# Patient Record
Sex: Female | Born: 1980
Health system: Southern US, Community
[De-identification: ages and names within clinical notes are randomized; demographics above are authoritative.]

## PROBLEM LIST (undated history)

## (undated) DIAGNOSIS — G8929 Other chronic pain: Secondary | ICD-10-CM

## (undated) DIAGNOSIS — E559 Vitamin D deficiency, unspecified: Secondary | ICD-10-CM

## (undated) DIAGNOSIS — K429 Umbilical hernia without obstruction or gangrene: Secondary | ICD-10-CM

## (undated) DIAGNOSIS — Q676 Pectus excavatum: Secondary | ICD-10-CM

## (undated) DIAGNOSIS — F419 Anxiety disorder, unspecified: Secondary | ICD-10-CM

## (undated) DIAGNOSIS — M797 Fibromyalgia: Secondary | ICD-10-CM

## (undated) DIAGNOSIS — M545 Low back pain, unspecified: Secondary | ICD-10-CM

## (undated) DIAGNOSIS — F32A Depression, unspecified: Secondary | ICD-10-CM

## (undated) DIAGNOSIS — R519 Headache, unspecified: Secondary | ICD-10-CM

## (undated) DIAGNOSIS — F319 Bipolar disorder, unspecified: Secondary | ICD-10-CM

## (undated) DIAGNOSIS — I499 Cardiac arrhythmia, unspecified: Secondary | ICD-10-CM

## (undated) DIAGNOSIS — M459 Ankylosing spondylitis of unspecified sites in spine: Secondary | ICD-10-CM

## (undated) DIAGNOSIS — F429 Obsessive-compulsive disorder, unspecified: Secondary | ICD-10-CM

## (undated) DIAGNOSIS — F329 Major depressive disorder, single episode, unspecified: Secondary | ICD-10-CM

## (undated) DIAGNOSIS — I10 Essential (primary) hypertension: Secondary | ICD-10-CM

## (undated) HISTORY — DX: Other chronic pain: G89.29

## (undated) HISTORY — DX: Pectus excavatum: Q67.6

## (undated) HISTORY — PX: TONSILLECTOMY: SUR1361

## (undated) HISTORY — PX: ABLATION: SHX5711

## (undated) HISTORY — DX: Headache, unspecified: R51.9

## (undated) HISTORY — DX: Bipolar disorder, unspecified: F31.9

## (undated) HISTORY — DX: Low back pain, unspecified: M54.50

## (undated) HISTORY — DX: Vitamin D deficiency, unspecified: E55.9

---
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
## 2013-08-01 ENCOUNTER — Emergency Department (HOSPITAL_COMMUNITY)
Admission: EM | Admit: 2013-08-01 | Discharge: 2013-08-01 | Disposition: A | Discharge status: Home / Self Care | Payer: Self-pay | Attending: Emergency Medicine | Admitting: Emergency Medicine

## 2013-08-01 ENCOUNTER — Encounter (HOSPITAL_COMMUNITY): Payer: Self-pay | Admitting: Emergency Medicine

## 2013-08-01 DIAGNOSIS — Y929 Unspecified place or not applicable: Secondary | ICD-10-CM | POA: Insufficient documentation

## 2013-08-01 DIAGNOSIS — Z87891 Personal history of nicotine dependence: Secondary | ICD-10-CM | POA: Insufficient documentation

## 2013-08-01 DIAGNOSIS — W261XXA Contact with sword or dagger, initial encounter: Secondary | ICD-10-CM

## 2013-08-01 DIAGNOSIS — S0181XA Laceration without foreign body of other part of head, initial encounter: Secondary | ICD-10-CM

## 2013-08-01 DIAGNOSIS — Z23 Encounter for immunization: Secondary | ICD-10-CM | POA: Insufficient documentation

## 2013-08-01 DIAGNOSIS — Y9389 Activity, other specified: Secondary | ICD-10-CM | POA: Insufficient documentation

## 2013-08-01 DIAGNOSIS — W260XXA Contact with knife, initial encounter: Secondary | ICD-10-CM | POA: Insufficient documentation

## 2013-08-01 DIAGNOSIS — S0180XA Unspecified open wound of other part of head, initial encounter: Secondary | ICD-10-CM | POA: Insufficient documentation

## 2013-08-01 MED ORDER — HYDROCODONE-ACETAMINOPHEN 5-325 MG PO TABS
2.0000 | ORAL_TABLET | Freq: Once | ORAL | Status: AC
  Administered 2013-08-01: 2 via ORAL
  Filled 2013-08-01: qty 2

## 2013-08-01 MED ORDER — LIDOCAINE-EPINEPHRINE-TETRACAINE (LET) SOLUTION
3.0000 mL | Freq: Once | NASAL | Status: AC
  Administered 2013-08-01: 3 mL via TOPICAL
  Filled 2013-08-01: qty 3

## 2013-08-01 MED ORDER — TETANUS-DIPHTH-ACELL PERTUSSIS 5-2.5-18.5 LF-MCG/0.5 IM SUSP
0.5000 mL | Freq: Once | INTRAMUSCULAR | Status: AC
  Administered 2013-08-01: 0.5 mL via INTRAMUSCULAR
  Filled 2013-08-01: qty 0.5

## 2013-08-01 NOTE — ED Provider Notes (Signed)
CSN: [id number]     Arrival date & time 08/01/13  0019 History   First MD Initiated Contact with Patient 08/01/13 0044     Chief Complaint  Patient presents with  . Laceration     (Consider location/radiation/quality/duration/timing/severity/associated sxs/prior Treatment) HPI Comments: Patient presents for laceration to chin sustained while attempting to remove an armband with a switch blade knife.  Patient is a 33 y.o. female presenting with skin laceration. The history is provided by the patient. No language interpreter was used.  Laceration Location:  Face Facial laceration location:  Chin Length (cm):  1 Depth:  Through dermis Quality: straight   Bleeding: controlled   Time since incident:  2 hours Laceration mechanism:  Knife Pain details:    Quality:  Aching   Severity:  Mild   Timing:  Constant   Progression:  Unchanged Foreign body present:  No foreign bodies Relieved by:  None tried Worsened by:  Pressure Ineffective treatments:  None tried Tetanus status:  Unknown   History reviewed. No pertinent past medical history. History reviewed. No pertinent past surgical history. No family history on file. History  Substance Use Topics  . Smoking status: Former Research scientist (life sciences)  . Smokeless tobacco: Not on file  . Alcohol Use: Yes     Comment: occ   OB History   Grav Para Term Preterm Abortions TAB SAB Ect Mult Living                 Review of Systems  Skin: Positive for wound.  Neurological: Negative for syncope and numbness.  Hematological: Does not bruise/bleed easily.  All other systems reviewed and are negative.     Allergies  Review of patient's allergies indicates no known allergies.  Home Medications   Prior to Admission medications   Not on File   BP 124/75  Pulse 98  Temp(Src) 98.4 F (36.9 C) (Oral)  Resp 18  Ht 5\' 6"  (1.676 m)  Wt 130 lb (58.968 kg)  BMI 20.99 kg/m2  SpO2 99%  LMP 07/18/2013  Physical Exam  Nursing note and vitals  reviewed. Constitutional: She is oriented to person, place, and time. She appears well-developed and well-nourished. No distress.  Nontoxic/nonseptic appearing  HENT:  Head: Normocephalic and atraumatic.  Eyes: Conjunctivae and EOM are normal. No scleral icterus.  Neck: Normal range of motion.  Pulmonary/Chest: Effort normal. No respiratory distress.  Musculoskeletal: Normal range of motion.  Neurological: She is alert and oriented to person, place, and time.  GCS 15. Speech is goal oriented. Sentences intelligible. Patient moves extremities without ataxia. Ambulatory with normal gait.  Skin: Skin is warm and dry. No rash noted. She is not diaphoretic. No erythema. No pallor.  1cm laceration to chin. No active bleeding.  Psychiatric: She has a normal mood and affect. Her behavior is normal.    ED Course  Procedures (including critical care time) Labs Review Labs Reviewed - No data to display  Imaging Review No results found.   EKG Interpretation None     LACERATION REPAIR Performed by: Antonietta Breach Authorized by: Antonietta Breach Consent: Verbal consent obtained. Risks and benefits: risks, benefits and alternatives were discussed Consent given by: patient Patient identity confirmed: provided demographic data Prepped and Draped in normal sterile fashion Wound explored  Laceration Location: chin  Laceration Length: 1cm  No Foreign Bodies seen or palpated  Anesthesia: local infiltration  Local anesthetic: topical LET  Anesthetic total: 5cc  Irrigation method: syringe Amount of cleaning: standard  Skin closure:  6-0 prolene  Number of sutures: 2  Technique: simple interrupted  Patient tolerance: Patient tolerated the procedure well with no immediate complications.  MDM   Final diagnoses:  Laceration of chin    Laceration to chin sustained while trying to remove an arm band with a switch blade knife. Tdap booster given. Pressure irrigation performed. Laceration  occurred < 8 hours prior to repair which was well tolerated. Pt has no comorbidities to effect normal wound healing. Discussed suture home care with patient and answered questions. Pt to follow up for wound check and suture removal in 5-7 days. Pt is hemodynamically stable w no complaints prior to discharge. Return precautions provided and patient agreeable to plan.   Filed Vitals:   08/01/13 0040 08/01/13 0222  BP: 124/75 130/96  Pulse: 98 81  Temp: 98.4 F (36.9 C) 98.5 F (36.9 C)  TempSrc: Oral Oral  Resp: 18 20  Height: 5\' 6"  (1.676 m)   Weight: 130 lb (58.968 kg)   SpO2: 99% 96%        Antonietta Breach, PA-C 08/01/13 [phone]

## 2013-08-01 NOTE — ED Notes (Signed)
LET on pt's chin.

## 2013-08-01 NOTE — ED Notes (Addendum)
Pt has 1/2 inch laceration to chin. Small amount of bleeding noted.

## 2013-08-01 NOTE — Discharge Instructions (Signed)

## 2013-08-01 NOTE — ED Notes (Signed)
Pt states she was attempting to removing arm band with switch blade knife and accidentally stabbed herself in chin. Small amount of bleeding noted.

## 2013-08-01 NOTE — ED Provider Notes (Signed)
Medical screening examination/treatment/procedure(s) were performed by non-physician practitioner and as supervising physician I was immediately available for consultation/collaboration.   EKG Interpretation None        Mariea Clonts, MD 08/01/13 [phone]

## 2017-07-30 ENCOUNTER — Other Ambulatory Visit: Payer: Self-pay | Admitting: Rheumatology

## 2017-07-30 DIAGNOSIS — M533 Sacrococcygeal disorders, not elsewhere classified: Secondary | ICD-10-CM

## 2017-08-14 ENCOUNTER — Inpatient Hospital Stay
Admission: RE | Admit: 2017-08-14 | Discharge: 2017-08-14 | Disposition: A | Discharge status: Home / Self Care | Payer: Self-pay | Source: Ambulatory Visit | Attending: Rheumatology | Admitting: Rheumatology

## 2018-02-17 DIAGNOSIS — H20012 Primary iridocyclitis, left eye: Secondary | ICD-10-CM | POA: Diagnosis not present

## 2018-03-03 DIAGNOSIS — H20012 Primary iridocyclitis, left eye: Secondary | ICD-10-CM | POA: Diagnosis not present

## 2018-04-17 DIAGNOSIS — G43909 Migraine, unspecified, not intractable, without status migrainosus: Secondary | ICD-10-CM | POA: Diagnosis not present

## 2018-04-17 DIAGNOSIS — H2013 Chronic iridocyclitis, bilateral: Secondary | ICD-10-CM | POA: Diagnosis not present

## 2018-04-17 DIAGNOSIS — Z8739 Personal history of other diseases of the musculoskeletal system and connective tissue: Secondary | ICD-10-CM | POA: Diagnosis not present

## 2018-04-17 DIAGNOSIS — Z23 Encounter for immunization: Secondary | ICD-10-CM | POA: Diagnosis not present

## 2018-04-17 DIAGNOSIS — F411 Generalized anxiety disorder: Secondary | ICD-10-CM | POA: Diagnosis not present

## 2018-09-12 DIAGNOSIS — M542 Cervicalgia: Secondary | ICD-10-CM | POA: Diagnosis not present

## 2018-09-12 DIAGNOSIS — S161XXS Strain of muscle, fascia and tendon at neck level, sequela: Secondary | ICD-10-CM | POA: Diagnosis not present

## 2018-10-02 DIAGNOSIS — R112 Nausea with vomiting, unspecified: Secondary | ICD-10-CM | POA: Diagnosis not present

## 2018-10-02 DIAGNOSIS — G43909 Migraine, unspecified, not intractable, without status migrainosus: Secondary | ICD-10-CM | POA: Diagnosis not present

## 2018-11-28 ENCOUNTER — Emergency Department (HOSPITAL_COMMUNITY): Payer: BC Managed Care – PPO

## 2018-11-28 ENCOUNTER — Other Ambulatory Visit: Payer: Self-pay

## 2018-11-28 ENCOUNTER — Emergency Department (HOSPITAL_COMMUNITY): Admission: EM | Admit: 2018-11-28 | Discharge: 2018-11-28 | Discharge status: Absent without leave | Payer: Self-pay

## 2018-11-28 ENCOUNTER — Emergency Department (HOSPITAL_COMMUNITY)
Admission: EM | Admit: 2018-11-28 | Discharge: 2018-11-28 | Disposition: A | Discharge status: Home / Self Care | Payer: BC Managed Care – PPO | Attending: Emergency Medicine | Admitting: Emergency Medicine

## 2018-11-28 DIAGNOSIS — R05 Cough: Secondary | ICD-10-CM | POA: Insufficient documentation

## 2018-11-28 DIAGNOSIS — R079 Chest pain, unspecified: Secondary | ICD-10-CM | POA: Diagnosis not present

## 2018-11-28 DIAGNOSIS — F17290 Nicotine dependence, other tobacco product, uncomplicated: Secondary | ICD-10-CM | POA: Insufficient documentation

## 2018-11-28 DIAGNOSIS — R9389 Abnormal findings on diagnostic imaging of other specified body structures: Secondary | ICD-10-CM | POA: Diagnosis not present

## 2018-11-28 DIAGNOSIS — R0781 Pleurodynia: Secondary | ICD-10-CM | POA: Diagnosis not present

## 2018-11-28 DIAGNOSIS — R091 Pleurisy: Secondary | ICD-10-CM | POA: Diagnosis not present

## 2018-11-28 DIAGNOSIS — R0602 Shortness of breath: Secondary | ICD-10-CM | POA: Diagnosis not present

## 2018-11-28 DIAGNOSIS — R059 Cough, unspecified: Secondary | ICD-10-CM

## 2018-11-28 LAB — BASIC METABOLIC PANEL
Anion gap: 8 (ref 5–15)
BUN: 11 mg/dL (ref 6–20)
CO2: 22 mmol/L (ref 22–32)
Calcium: 9.1 mg/dL (ref 8.9–10.3)
Chloride: 107 mmol/L (ref 98–111)
Creatinine, Ser: 0.6 mg/dL (ref 0.44–1.00)
GFR calc Af Amer: >60 mL/min (ref >60)
GFR calc non Af Amer: >60 mL/min (ref >60)
Glucose, Bld: 82 mg/dL (ref 70–99)
Potassium: 3.7 mmol/L (ref 3.5–5.1)
Sodium: 137 mmol/L (ref 135–145)

## 2018-11-28 LAB — CBC WITH DIFFERENTIAL/PLATELET
Abs Immature Granulocytes: 0.01 10*3/uL (ref 0.00–0.07)
Basophils Absolute: 0 10*3/uL (ref 0.0–0.1)
Basophils Relative: 0 %
Eosinophils Absolute: 0.1 10*3/uL (ref 0.0–0.5)
Eosinophils Relative: 2 %
HCT: 43 % (ref 36.0–46.0)
Hemoglobin: 14.6 g/dL (ref 12.0–15.0)
Immature Granulocytes: 0 %
Lymphocytes Relative: 24 %
Lymphs Abs: 1.8 10*3/uL (ref 0.7–4.0)
MCH: 31.8 pg (ref 26.0–34.0)
MCHC: 34 g/dL (ref 30.0–36.0)
MCV: 93.7 fL (ref 80.0–100.0)
Monocytes Absolute: 0.4 10*3/uL (ref 0.1–1.0)
Monocytes Relative: 6 %
Neutro Abs: 4.9 10*3/uL (ref 1.7–7.7)
Neutrophils Relative %: 68 %
Platelets: 281 10*3/uL (ref 150–400)
RBC: 4.59 MIL/uL (ref 3.87–5.11)
RDW: 12.5 % (ref 11.5–15.5)
WBC: 7.2 10*3/uL (ref 4.0–10.5)
nRBC: 0 % (ref 0.0–0.2)

## 2018-11-28 MED ORDER — IOHEXOL 350 MG/ML SOLN
100.0000 mL | Freq: Once | INTRAVENOUS | Status: AC | PRN
  Administered 2018-11-28: 100 mL via INTRAVENOUS

## 2018-11-28 MED ORDER — SODIUM CHLORIDE (PF) 0.9 % IJ SOLN
INTRAMUSCULAR | Status: AC
  Filled 2018-11-28: qty 50

## 2018-11-28 MED ORDER — IBUPROFEN 600 MG PO TABS: 600.0000 mg | ORAL_TABLET | Freq: Four times a day (QID) | ORAL | 0 refills | Status: DC | PRN

## 2018-11-28 MED ORDER — SODIUM CHLORIDE 0.9 % IV SOLN
INTRAVENOUS | Status: DC
  Administered 2018-11-28: 17:00:00 via INTRAVENOUS

## 2018-11-28 MED ORDER — KETOROLAC TROMETHAMINE 30 MG/ML IJ SOLN
30.0000 mg | Freq: Once | INTRAMUSCULAR | Status: AC
  Administered 2018-11-28: 19:00:00 30 mg via INTRAVENOUS
  Filled 2018-11-28: qty 1

## 2018-11-28 MED ORDER — ALBUTEROL SULFATE (2.5 MG/3ML) 0.083% IN NEBU: 5.0000 mg | INHALATION_SOLUTION | Freq: Once | RESPIRATORY_TRACT | Status: DC

## 2018-11-28 MED ORDER — METHOCARBAMOL 750 MG PO TABS: 750.0000 mg | ORAL_TABLET | Freq: Four times a day (QID) | ORAL | 0 refills | Status: DC

## 2018-11-28 NOTE — ED Triage Notes (Signed)
Pt states she has had L lateral mid rib pain radiating to L back x 1 week. Pt states area is tender and she states she has difficulty taking deep breaths due to pain. Denies fever. Was seen at [facility] today and XR done, disc with pt, UC MD told pt that there was "fluid in lung".

## 2018-11-28 NOTE — ED Provider Notes (Signed)
[facility] DEPT Provider Note   CSN: [id number] Arrival date & time: 11/28/18  1254     History   Chief Complaint Chief Complaint  Patient presents with  . Chest Pain  . Shortness of Breath    HPI Kayla Gross is a 38 y.o. female.     38 year old female presents with 1 week history of left-sided pleuritic chest pain.  Denies any fever or cough.  Pain is been gradually increasing.  No associated lower extremity pain.  Went to urgent care and had a chest x-ray which showed fluid on her lungs.  Patient does vape.  Sent here for further evaluation     No past medical history on file.  There are no active problems to display for this patient.   No past surgical history on file.   OB History   No obstetric history on file.      Home Medications    Prior to Admission medications   Not on File    Family History No family history on file.  Social History Social History   Tobacco Use  . Smoking status: Former Smoker  Substance Use Topics  . Alcohol use: Yes    Comment: occ  . Drug use: No     Allergies   Patient has no known allergies.   Review of Systems Review of Systems  All other systems reviewed and are negative.    Physical Exam Updated Vital Signs BP 137/84 (BP Location: Left Arm)   Pulse 68   Temp 98.5 F (36.9 C) (Oral)   Resp 16   LMP 11/19/2018 (Approximate)   SpO2 100%   Physical Exam Vitals signs and nursing note reviewed.  Constitutional:      General: She is not in acute distress.    Appearance: Normal appearance. She is well-developed. She is not toxic-appearing.  HENT:     Head: Normocephalic and atraumatic.  Eyes:     General: Lids are normal.     Conjunctiva/sclera: Conjunctivae normal.     Pupils: Pupils are equal, round, and reactive to light.  Neck:     Musculoskeletal: Normal range of motion and neck supple.     Thyroid: No thyroid mass.     Trachea: No tracheal deviation.   Cardiovascular:     Rate and Rhythm: Normal rate and regular rhythm.     Heart sounds: Normal heart sounds. No murmur. No gallop.   Pulmonary:     Effort: Pulmonary effort is normal. No respiratory distress.     Breath sounds: No stridor. Examination of the left-lower field reveals decreased breath sounds. Decreased breath sounds present. No wheezing, rhonchi or rales.  Abdominal:     General: Bowel sounds are normal. There is no distension.     Palpations: Abdomen is soft.     Tenderness: There is no abdominal tenderness. There is no rebound.  Musculoskeletal: Normal range of motion.        General: No tenderness.  Skin:    General: Skin is warm and dry.     Findings: No abrasion or rash.  Neurological:     Mental Status: She is alert and oriented to person, place, and time.     GCS: GCS eye subscore is 4. GCS verbal subscore is 5. GCS motor subscore is 6.     Cranial Nerves: No cranial nerve deficit.     Sensory: No sensory deficit.  Psychiatric:        Speech: Speech normal.  Behavior: Behavior normal.      ED Treatments / Results  Labs (all labs ordered are listed, but only abnormal results are displayed) Labs Reviewed  CBC WITH DIFFERENTIAL/PLATELET  BASIC METABOLIC PANEL    EKG EKG Interpretation  Date/Time:  Friday November 28 2018 13:24:48 EDT Ventricular Rate:  69 PR Interval:    QRS Duration: 115 QT Interval:  384 QTC Calculation: 412 R Axis:   40 Text Interpretation:  Sinus rhythm Probable left atrial enlargement Incomplete right bundle branch block No old tracing to compare Confirmed by Lacretia Leigh ([id number]) on 11/28/2018 4:00:36 PM   Radiology No results found.  Procedures Procedures (including critical care time)  Medications Ordered in ED Medications  0.9 %  sodium chloride infusion (has no administration in time range)     Initial Impression / Assessment and Plan / ED Course  I have reviewed the triage vital signs and the nursing  notes.  Pertinent labs & imaging results that were available during my care of the patient were reviewed by me and considered in my medical decision making (see chart for details).        Patient treated for pain here and does feel better.  Chest x-ray unremarkable and patient simply had a chest CT which was negative for PE or pleural effusion.  Suspect pleurisy and will treat for that  Final Clinical Impressions(s) / ED Diagnoses   Final diagnoses:  Cough    ED Discharge Orders    None       Lacretia Leigh, MD 11/28/18 [phone]

## 2018-12-10 ENCOUNTER — Other Ambulatory Visit: Payer: Self-pay

## 2018-12-10 ENCOUNTER — Emergency Department (HOSPITAL_COMMUNITY)
Admission: EM | Admit: 2018-12-10 | Discharge: 2018-12-10 | Discharge status: Against Medical Advice | Payer: BC Managed Care – PPO | Attending: Emergency Medicine | Admitting: Emergency Medicine

## 2018-12-10 ENCOUNTER — Encounter (HOSPITAL_COMMUNITY): Payer: Self-pay

## 2018-12-10 DIAGNOSIS — R0789 Other chest pain: Secondary | ICD-10-CM | POA: Diagnosis not present

## 2018-12-10 DIAGNOSIS — M542 Cervicalgia: Secondary | ICD-10-CM | POA: Diagnosis not present

## 2018-12-10 DIAGNOSIS — Z5321 Procedure and treatment not carried out due to patient leaving prior to being seen by health care provider: Secondary | ICD-10-CM | POA: Diagnosis not present

## 2018-12-10 NOTE — ED Triage Notes (Signed)
Pt c/o rib pain on the left side radiating to her neck and back. Pt was diagnosed with pleurisy several weeks ago and was told to come back if her pain was not any better. Pt did call her PCP and was told to come to the ER.

## 2018-12-11 DIAGNOSIS — R918 Other nonspecific abnormal finding of lung field: Secondary | ICD-10-CM | POA: Diagnosis not present

## 2018-12-11 DIAGNOSIS — R0602 Shortness of breath: Secondary | ICD-10-CM | POA: Diagnosis not present

## 2018-12-11 DIAGNOSIS — R0789 Other chest pain: Secondary | ICD-10-CM | POA: Diagnosis not present

## 2018-12-11 DIAGNOSIS — F17290 Nicotine dependence, other tobacco product, uncomplicated: Secondary | ICD-10-CM | POA: Diagnosis not present

## 2018-12-11 DIAGNOSIS — R7989 Other specified abnormal findings of blood chemistry: Secondary | ICD-10-CM | POA: Diagnosis not present

## 2018-12-11 DIAGNOSIS — R0781 Pleurodynia: Secondary | ICD-10-CM | POA: Diagnosis not present

## 2018-12-11 DIAGNOSIS — Z79899 Other long term (current) drug therapy: Secondary | ICD-10-CM | POA: Diagnosis not present

## 2018-12-17 DIAGNOSIS — I493 Ventricular premature depolarization: Secondary | ICD-10-CM | POA: Diagnosis not present

## 2018-12-17 DIAGNOSIS — F410 Panic disorder [episodic paroxysmal anxiety] without agoraphobia: Secondary | ICD-10-CM | POA: Insufficient documentation

## 2018-12-17 DIAGNOSIS — F419 Anxiety disorder, unspecified: Secondary | ICD-10-CM | POA: Insufficient documentation

## 2018-12-17 DIAGNOSIS — R079 Chest pain, unspecified: Secondary | ICD-10-CM | POA: Diagnosis not present

## 2018-12-17 DIAGNOSIS — R002 Palpitations: Secondary | ICD-10-CM | POA: Diagnosis not present

## 2018-12-19 ENCOUNTER — Emergency Department (HOSPITAL_COMMUNITY): Payer: BC Managed Care – PPO

## 2018-12-19 ENCOUNTER — Other Ambulatory Visit: Payer: Self-pay

## 2018-12-19 ENCOUNTER — Encounter (HOSPITAL_COMMUNITY): Payer: Self-pay

## 2018-12-19 DIAGNOSIS — R0602 Shortness of breath: Secondary | ICD-10-CM | POA: Diagnosis not present

## 2018-12-19 DIAGNOSIS — R079 Chest pain, unspecified: Secondary | ICD-10-CM | POA: Diagnosis not present

## 2018-12-19 DIAGNOSIS — R0789 Other chest pain: Secondary | ICD-10-CM | POA: Insufficient documentation

## 2018-12-19 DIAGNOSIS — Z79899 Other long term (current) drug therapy: Secondary | ICD-10-CM | POA: Diagnosis not present

## 2018-12-19 DIAGNOSIS — R42 Dizziness and giddiness: Secondary | ICD-10-CM | POA: Diagnosis not present

## 2018-12-19 DIAGNOSIS — Z87891 Personal history of nicotine dependence: Secondary | ICD-10-CM | POA: Insufficient documentation

## 2018-12-19 DIAGNOSIS — R55 Syncope and collapse: Secondary | ICD-10-CM | POA: Diagnosis not present

## 2018-12-19 LAB — CBC
HCT: 41.2 % (ref 36.0–46.0)
Hemoglobin: 14.3 g/dL (ref 12.0–15.0)
MCH: 32.2 pg (ref 26.0–34.0)
MCHC: 34.7 g/dL (ref 30.0–36.0)
MCV: 92.8 fL (ref 80.0–100.0)
Platelets: 310 10*3/uL (ref 150–400)
RBC: 4.44 MIL/uL (ref 3.87–5.11)
RDW: 12.1 % (ref 11.5–15.5)
WBC: 12.5 10*3/uL — ABNORMAL HIGH (ref 4.0–10.5)
nRBC: 0 % (ref 0.0–0.2)

## 2018-12-19 LAB — I-STAT BETA HCG BLOOD, ED (NOT ORDERABLE): I-stat hCG, quantitative: <5 m[IU]/mL (ref <5)

## 2018-12-19 LAB — BASIC METABOLIC PANEL
Anion gap: 9 (ref 5–15)
BUN: 8 mg/dL (ref 6–20)
CO2: 21 mmol/L — ABNORMAL LOW (ref 22–32)
Calcium: 9.3 mg/dL (ref 8.9–10.3)
Chloride: 107 mmol/L (ref 98–111)
Creatinine, Ser: 0.63 mg/dL (ref 0.44–1.00)
GFR calc Af Amer: >60 mL/min (ref >60)
GFR calc non Af Amer: >60 mL/min (ref >60)
Glucose, Bld: 95 mg/dL (ref 70–99)
Potassium: 3.7 mmol/L (ref 3.5–5.1)
Sodium: 137 mmol/L (ref 135–145)

## 2018-12-19 LAB — TROPONIN I (HIGH SENSITIVITY)
Troponin I (High Sensitivity): <2 ng/L (ref <18)
Troponin I (High Sensitivity): <2 ng/L (ref <18)

## 2018-12-19 MED ORDER — SODIUM CHLORIDE 0.9% FLUSH: 3.0000 mL | Freq: Once | INTRAVENOUS | Status: DC

## 2018-12-19 NOTE — ED Triage Notes (Signed)
Patient reports that she has had SOB x 1 month, but today the SOB is much worse. Patient states talking, walking has been a struggle when it normally isn't. Patient also c/o near syncope and then she became anxious. Patient reports that she was diagnosed with pleurisy recently.

## 2018-12-20 ENCOUNTER — Emergency Department (HOSPITAL_COMMUNITY)
Admission: EM | Admit: 2018-12-20 | Discharge: 2018-12-20 | Disposition: A | Discharge status: Home / Self Care | Payer: BC Managed Care – PPO | Attending: Emergency Medicine | Admitting: Emergency Medicine

## 2018-12-20 ENCOUNTER — Other Ambulatory Visit: Payer: Self-pay

## 2018-12-20 DIAGNOSIS — R0789 Other chest pain: Secondary | ICD-10-CM

## 2018-12-20 DIAGNOSIS — R0602 Shortness of breath: Secondary | ICD-10-CM

## 2018-12-20 DIAGNOSIS — R55 Syncope and collapse: Secondary | ICD-10-CM

## 2018-12-20 LAB — T4, FREE: Free T4: 1.03 ng/dL (ref 0.61–1.12)

## 2018-12-20 LAB — TSH: TSH: 1.77 u[IU]/mL (ref 0.350–4.500)

## 2018-12-20 MED ORDER — SODIUM CHLORIDE 0.9 % IV BOLUS
1000.0000 mL | Freq: Once | INTRAVENOUS | Status: AC
  Administered 2018-12-20: 1000 mL via INTRAVENOUS

## 2018-12-20 MED ORDER — KETOROLAC TROMETHAMINE 15 MG/ML IJ SOLN
15.0000 mg | Freq: Once | INTRAMUSCULAR | Status: AC
  Administered 2018-12-20: 02:00:00 15 mg via INTRAVENOUS
  Filled 2018-12-20: qty 1

## 2018-12-20 NOTE — ED Notes (Signed)
AVS and incentive spirometer reviewed with patient. Patient demonstrated teach back and use of incentive spirometer.

## 2018-12-20 NOTE — ED Provider Notes (Signed)
[facility] DEPT Provider Note  CSN: [id number] Arrival date & time: 12/19/18 1812  Chief Complaint(s) Shortness of Breath, Anxiety, and Near Syncope  HPI Kayla Gross is a 38 y.o. female   The history is provided by the patient.  Near Syncope This is a new problem. The current episode started 3 to 5 hours ago. Episode frequency: ONCE. The problem has been resolved. Associated symptoms include chest pain (Chest wall pain,) and shortness of breath (chronic). Pertinent negatives include no abdominal pain and no headaches. The symptoms are aggravated by standing. Relieved by: sitting.   Patient reports that she was at work when the episode occurred.  Stated she was crouching down and stood up.  That is when she had the near syncopal episode.  No recent fevers or infections.  No nausea vomiting.  No diarrhea.  Patient endorsing chest wall pain for which she has been evaluated.  It was attributed to pectoral excavatum.  Was recently evaluated in the emergency department and had negative CT scan for PEs.  Patient reports that she just turned in a Holter monitor yesterday.  Past Medical History History reviewed. No pertinent past medical history. There are no active problems to display for this patient.  Home Medication(s) Prior to Admission medications   Medication Sig Start Date End Date Taking? Authorizing Provider  FLUoxetine (PROZAC) 10 MG tablet Take 10 mg by mouth daily.    Yes [provider]  ibuprofen (ADVIL) 600 MG tablet Take 1 tablet (600 mg total) by mouth every 6 (six) hours as needed. Patient taking differently: Take 600 mg by mouth every 6 (six) hours as needed for moderate pain.  11/28/18  Yes Lacretia Leigh, MD  methocarbamol (ROBAXIN-750) 750 MG tablet Take 1 tablet (750 mg total) by mouth 4 (four) times daily. Patient taking differently: Take 750 mg by mouth every 8 (eight) hours as needed for muscle spasms.  11/28/18  Yes Lacretia Leigh, MD  promethazine (PHENERGAN) 12.5 MG tablet Take 12.5 mg by mouth every 8 (eight) hours as needed for nausea or vomiting.  10/02/18  Yes [provider]  SUMAtriptan (IMITREX) 50 MG tablet Take 50 mg by mouth every 2 (two) hours as needed for migraine.  10/27/18  Yes [provider]  traMADol (ULTRAM) 50 MG tablet Take 50 mg by mouth every 6 (six) hours as needed for moderate pain.  12/11/18  Yes [provider]                                                                                                                                    Past Surgical History Past Surgical History:  Procedure Laterality Date  . TONSILLECTOMY     Family History Family History  Family history unknown: Yes    Social History Social History   Tobacco Use  . Smoking status: Former Research scientist (life sciences)  . Smokeless tobacco: Never Used  Substance  Use Topics  . Alcohol use: Yes    Comment: occ  . Drug use: No   Allergies Patient has no known allergies.  Review of Systems Review of Systems  Respiratory: Positive for shortness of breath (chronic).   Cardiovascular: Positive for chest pain (Chest wall pain,) and near-syncope.  Gastrointestinal: Negative for abdominal pain.  Neurological: Negative for headaches.   All other systems are reviewed and are negative for acute change except as noted in the HPI  Physical Exam Vital Signs  I have reviewed the triage vital signs BP (!) 140/104 (BP Location: Left Arm)   Pulse 61   Temp 98.1 F (36.7 C) (Oral)   Resp 17   Ht 5\' 6"  (1.676 m)   Wt 79.4 kg   LMP 12/19/2018   SpO2 100%   BMI 28.25 kg/m   Physical Exam Vitals signs reviewed.  Constitutional:      General: She is not in acute distress.    Appearance: She is well-developed. She is not diaphoretic.  HENT:     Head: Normocephalic and atraumatic.     Nose: Nose normal.  Eyes:     General: No scleral icterus.       Right eye: No discharge.        Left eye: No  discharge.     Conjunctiva/sclera: Conjunctivae normal.     Pupils: Pupils are equal, round, and reactive to light.  Neck:     Musculoskeletal: Normal range of motion and neck supple.  Cardiovascular:     Rate and Rhythm: Normal rate and regular rhythm.     Heart sounds: No murmur. No friction rub. No gallop.   Pulmonary:     Effort: Pulmonary effort is normal. No respiratory distress.     Breath sounds: Normal breath sounds. No stridor. No rales.    Chest:     Chest wall: Tenderness present.    Abdominal:     General: There is no distension.     Palpations: Abdomen is soft.     Tenderness: There is no abdominal tenderness.  Musculoskeletal:        General: No tenderness.  Skin:    General: Skin is warm and dry.     Findings: No erythema or rash.  Neurological:     Mental Status: She is alert and oriented to person, place, and time.     ED Results and Treatments Labs (all labs ordered are listed, but only abnormal results are displayed) Labs Reviewed  BASIC METABOLIC PANEL - Abnormal; Notable for the following components:      Result Value   CO2 21 (*)    All other components within normal limits  CBC - Abnormal; Notable for the following components:   WBC 12.5 (*)    All other components within normal limits  TSH  T4, FREE  I-STAT BETA HCG BLOOD, ED ([facility], [facility], [facility] ONLY)  I-STAT BETA HCG BLOOD, ED (NOT ORDERABLE)  TROPONIN I (HIGH SENSITIVITY)  TROPONIN I (HIGH SENSITIVITY)  EKG  EKG Interpretation  Date/Time:  Friday December 19 2018 18:28:29 EDT Ventricular Rate:  101 PR Interval:    QRS Duration: 114 QT Interval:  392 QTC Calculation: 395 R Axis:   40 Text Interpretation:  Sinus tachycardia Ventricular bigeminy Probable left atrial enlargement Incomplete right bundle branch block Borderline low voltage, extremity leads Confirmed by Addison Lank [phone]) on 12/20/2018 5:19:11 AM       EKG Interpretation  Date/Time:  Saturday December 20 2018 01:51:41 EDT Ventricular Rate:  46 PR Interval:    QRS Duration: 117 QT Interval:  471 QTC Calculation: 412 R Axis:   35 Text Interpretation:  Sinus bradycardia Atrial premature complex Probable left atrial enlargement Nonspecific intraventricular conduction delay Borderline T abnormalities, anterior leads bigemy resolved Confirmed by Addison Lank [phone]) on 12/20/2018 5:20:24 AM       Radiology Dg Chest 2 View  Result Date: 12/19/2018 CLINICAL DATA:  38 year female with chest pain and shortness of breath. EXAM: CHEST - 2 VIEW COMPARISON:  Chest radiograph dated 11/28/2018 FINDINGS: The heart size and mediastinal contours are within normal limits. Both lungs are clear. The visualized skeletal structures are unremarkable. IMPRESSION: No active cardiopulmonary disease. Electronically Signed   By: Anner Crete M.D.   On: 12/19/2018 19:02    Pertinent labs & imaging results that were available during my care of the patient were reviewed by me and considered in my medical decision making (see chart for details).  Medications Ordered in ED Medications  sodium chloride flush (NS) 0.9 % injection 3 mL (has no administration in time range)  ketorolac (TORADOL) 15 MG/ML injection 15 mg (15 mg Intravenous Given 12/20/18 0149)  sodium chloride 0.9 % bolus 1,000 mL (0 mLs Intravenous Stopped 12/20/18 0452)                                                                                                                                    Procedures Procedures  (including critical care time)  Medical Decision Making / ED Course I have reviewed the nursing notes for this encounter and the patient's prior records (if available in EHR or on provided paperwork).   Kayla Gross was evaluated in Emergency Department on 12/20/2018 for the symptoms described in the history of present  illness. She was evaluated in the context of the global COVID-19 pandemic, which necessitated consideration that the patient might be at risk for infection with the SARS-CoV-2 virus that causes COVID-19. Institutional protocols and algorithms that pertain to the evaluation of patients at risk for COVID-19 are in a state of rapid change based on information released by regulatory bodies including the CDC and federal and state organizations. These policies and algorithms were followed during the patient's care in the ED.  Initial EKG in bigeminy.  Currently in normal sinus rhythm.  Repeat EKG without bigeminy.  No evidence of pericarditis.  No acute ischemic changes.  Troponin  negative x2.   Orthostatics reassuring.  Beta-hCG negative.  No significant electrolyte derangements or renal insufficiency.  TSH normal.  Provided with Toradol for chest wall pain and IV fluids.  Patient no longer symptomatic following IV fluids.  Able to stand up and ambulate without complication.  The patient appears reasonably screened and/or stabilized for discharge and I doubt any other medical condition or other [facility] requiring further screening, evaluation, or treatment in the ED at this time prior to discharge.  The patient is safe for discharge with strict return precautions.       Final Clinical Impression(s) / ED Diagnoses Final diagnoses:  Near syncope  Chest wall pain  Shortness of breath    The patient appears reasonably screened and/or stabilized for discharge and I doubt any other medical condition or other [facility] requiring further screening, evaluation, or treatment in the ED at this time prior to discharge.  Disposition: Discharge  Condition: Good  I have discussed the results, Dx and Tx plan with the patient who expressed understanding and agree(s) with the plan. Discharge instructions discussed at great length. The patient was given strict return precautions who verbalized understanding of the  instructions. No further questions at time of discharge.    ED Discharge Orders    None       Follow Up: [facility] [place] [address] [phone]  Schedule an appointment as soon as possible for a visit        This chart was dictated using voice recognition software.  Despite best efforts to proofread,  errors can occur which can change the documentation meaning.   Fatima Blank, MD 12/20/18 Delrae Rend

## 2018-12-22 DIAGNOSIS — Q676 Pectus excavatum: Secondary | ICD-10-CM | POA: Diagnosis not present

## 2018-12-25 DIAGNOSIS — R079 Chest pain, unspecified: Secondary | ICD-10-CM | POA: Diagnosis not present

## 2018-12-25 DIAGNOSIS — R002 Palpitations: Secondary | ICD-10-CM | POA: Diagnosis not present

## 2019-01-06 DIAGNOSIS — R079 Chest pain, unspecified: Secondary | ICD-10-CM | POA: Diagnosis not present

## 2019-01-06 DIAGNOSIS — R002 Palpitations: Secondary | ICD-10-CM | POA: Diagnosis not present

## 2019-01-07 ENCOUNTER — Institutional Professional Consult (permissible substitution): Payer: BC Managed Care – PPO | Admitting: Pulmonary Disease

## 2019-01-07 DIAGNOSIS — R55 Syncope and collapse: Secondary | ICD-10-CM | POA: Diagnosis not present

## 2019-01-07 DIAGNOSIS — R002 Palpitations: Secondary | ICD-10-CM | POA: Diagnosis not present

## 2019-01-07 DIAGNOSIS — F17200 Nicotine dependence, unspecified, uncomplicated: Secondary | ICD-10-CM | POA: Diagnosis not present

## 2019-01-07 DIAGNOSIS — Z79899 Other long term (current) drug therapy: Secondary | ICD-10-CM | POA: Diagnosis not present

## 2019-01-07 DIAGNOSIS — I493 Ventricular premature depolarization: Secondary | ICD-10-CM | POA: Diagnosis not present

## 2019-01-07 DIAGNOSIS — R42 Dizziness and giddiness: Secondary | ICD-10-CM | POA: Diagnosis not present

## 2019-01-09 ENCOUNTER — Other Ambulatory Visit: Payer: Self-pay | Admitting: *Deleted

## 2019-01-09 DIAGNOSIS — R918 Other nonspecific abnormal finding of lung field: Secondary | ICD-10-CM | POA: Diagnosis not present

## 2019-01-09 DIAGNOSIS — R079 Chest pain, unspecified: Secondary | ICD-10-CM | POA: Diagnosis not present

## 2019-01-09 DIAGNOSIS — M1990 Unspecified osteoarthritis, unspecified site: Secondary | ICD-10-CM | POA: Diagnosis not present

## 2019-01-09 DIAGNOSIS — F17290 Nicotine dependence, other tobacco product, uncomplicated: Secondary | ICD-10-CM | POA: Diagnosis not present

## 2019-01-09 DIAGNOSIS — F419 Anxiety disorder, unspecified: Secondary | ICD-10-CM | POA: Diagnosis not present

## 2019-01-09 DIAGNOSIS — Q676 Pectus excavatum: Secondary | ICD-10-CM

## 2019-01-09 DIAGNOSIS — Z8711 Personal history of peptic ulcer disease: Secondary | ICD-10-CM | POA: Diagnosis not present

## 2019-01-09 DIAGNOSIS — R0602 Shortness of breath: Secondary | ICD-10-CM | POA: Diagnosis not present

## 2019-01-09 DIAGNOSIS — F329 Major depressive disorder, single episode, unspecified: Secondary | ICD-10-CM | POA: Diagnosis not present

## 2019-01-09 DIAGNOSIS — Z79899 Other long term (current) drug therapy: Secondary | ICD-10-CM | POA: Diagnosis not present

## 2019-01-09 DIAGNOSIS — R0789 Other chest pain: Secondary | ICD-10-CM | POA: Diagnosis not present

## 2019-01-09 DIAGNOSIS — F410 Panic disorder [episodic paroxysmal anxiety] without agoraphobia: Secondary | ICD-10-CM | POA: Diagnosis not present

## 2019-01-09 DIAGNOSIS — I493 Ventricular premature depolarization: Secondary | ICD-10-CM | POA: Diagnosis not present

## 2019-01-09 DIAGNOSIS — R002 Palpitations: Secondary | ICD-10-CM | POA: Diagnosis not present

## 2019-01-09 DIAGNOSIS — R5383 Other fatigue: Secondary | ICD-10-CM | POA: Diagnosis not present

## 2019-01-09 DIAGNOSIS — F17200 Nicotine dependence, unspecified, uncomplicated: Secondary | ICD-10-CM | POA: Diagnosis not present

## 2019-01-09 DIAGNOSIS — R42 Dizziness and giddiness: Secondary | ICD-10-CM | POA: Diagnosis not present

## 2019-01-10 DIAGNOSIS — R002 Palpitations: Secondary | ICD-10-CM | POA: Diagnosis not present

## 2019-01-11 DIAGNOSIS — R002 Palpitations: Secondary | ICD-10-CM | POA: Diagnosis not present

## 2019-01-11 DIAGNOSIS — I493 Ventricular premature depolarization: Secondary | ICD-10-CM | POA: Diagnosis not present

## 2019-01-11 DIAGNOSIS — Z79899 Other long term (current) drug therapy: Secondary | ICD-10-CM | POA: Diagnosis not present

## 2019-01-11 DIAGNOSIS — Z8711 Personal history of peptic ulcer disease: Secondary | ICD-10-CM | POA: Diagnosis not present

## 2019-01-11 DIAGNOSIS — R5383 Other fatigue: Secondary | ICD-10-CM | POA: Diagnosis not present

## 2019-01-11 DIAGNOSIS — F17290 Nicotine dependence, other tobacco product, uncomplicated: Secondary | ICD-10-CM | POA: Diagnosis not present

## 2019-01-11 DIAGNOSIS — F419 Anxiety disorder, unspecified: Secondary | ICD-10-CM | POA: Diagnosis not present

## 2019-01-11 DIAGNOSIS — F17200 Nicotine dependence, unspecified, uncomplicated: Secondary | ICD-10-CM | POA: Diagnosis not present

## 2019-01-11 DIAGNOSIS — M1990 Unspecified osteoarthritis, unspecified site: Secondary | ICD-10-CM | POA: Diagnosis not present

## 2019-01-11 DIAGNOSIS — R42 Dizziness and giddiness: Secondary | ICD-10-CM | POA: Diagnosis not present

## 2019-01-11 DIAGNOSIS — F410 Panic disorder [episodic paroxysmal anxiety] without agoraphobia: Secondary | ICD-10-CM | POA: Diagnosis not present

## 2019-01-11 DIAGNOSIS — F329 Major depressive disorder, single episode, unspecified: Secondary | ICD-10-CM | POA: Diagnosis not present

## 2019-01-11 DIAGNOSIS — R0789 Other chest pain: Secondary | ICD-10-CM | POA: Diagnosis not present

## 2019-01-13 DIAGNOSIS — I493 Ventricular premature depolarization: Secondary | ICD-10-CM | POA: Diagnosis not present

## 2019-01-13 DIAGNOSIS — R002 Palpitations: Secondary | ICD-10-CM | POA: Diagnosis not present

## 2019-01-15 ENCOUNTER — Encounter: Payer: Self-pay | Admitting: Pulmonary Disease

## 2019-01-15 ENCOUNTER — Ambulatory Visit (INDEPENDENT_AMBULATORY_CARE_PROVIDER_SITE_OTHER): Payer: BC Managed Care – PPO | Admitting: Pulmonary Disease

## 2019-01-15 ENCOUNTER — Other Ambulatory Visit: Payer: Self-pay

## 2019-01-15 VITALS — BP 96/62 | HR 91 | Temp 98.3°F | Ht 65.0 in | Wt 174.6 lb

## 2019-01-15 DIAGNOSIS — Q676 Pectus excavatum: Secondary | ICD-10-CM | POA: Diagnosis not present

## 2019-01-15 DIAGNOSIS — R002 Palpitations: Secondary | ICD-10-CM

## 2019-01-15 NOTE — Progress Notes (Signed)
Subjective:   PATIENT ID: Kayla Gross GENDER: female DOB: 05/16/1980, MRN: [id number]   HPI  Chief Complaint  Patient presents with  . Consult    pain left side of chest 3 months ago    Reason for Visit: New consult for pectus excavatum  Ms. Kayla Gross is a 38 year old female with chronic headaches, anxiety and PVCs who presents with left rib pain and pectus excavatum.  She has had pectus excavatum since birth and denies any physical limitations related to it. Recently starting 10 months ago, she reports frequent anxiety attacks that have progressed to occurring daily with the sensation of her chest tightness. She was evaluated at an urgent care clinic for palpitations, chest tightness and dizziness and found with PVCs. She has been evaluated by [facility]. Per clinic note on 01/13/19, 30 day event monitor was placed. She reports that she had an irregular heart beat when she was elementary school at the age of 49. Denies limited activity, syncope. She was a cross country runner.   Social History: Vaping twice a day. Vaping x 5 years. Starting smoking 15. Denies genetic diseases in the family (Marfan's, Noonan) or cardiac history Currently works a Scientist, clinical (histocompatibility and immunogenetics) and stands on her feet most of the day but no cardio exercise.   I have personally reviewed patient's past medical/family/social history, allergies, current medications.  Past Medical History:  Diagnosis Date  . Chronic headache      Family History  Family history unknown: Yes  Does not know birth mom.  Social History   Occupational History  . Not on file  Tobacco Use  . Smoking status: Former Smoker    Packs/day: 0.25    Years: 19.00    Pack years: 4.75    Types: Cigarettes    Start date: 1996    Quit date: 2015    Years since quitting: 5.7  . Smokeless tobacco: Never Used  Substance and Sexual Activity  . Alcohol use: Yes    Comment: occ  . Drug use: No  . Sexual activity: Not on file     No Known Allergies   Outpatient Medications Prior to Visit  Medication Sig Dispense Refill  . FLUoxetine (PROZAC) 10 MG tablet Take 10 mg by mouth daily.     Marland Kitchen ibuprofen (ADVIL) 600 MG tablet Take 1 tablet (600 mg total) by mouth every 6 (six) hours as needed. (Patient taking differently: Take 600 mg by mouth every 6 (six) hours as needed for moderate pain. ) 30 tablet 0  . methocarbamol (ROBAXIN-750) 750 MG tablet Take 1 tablet (750 mg total) by mouth 4 (four) times daily. (Patient taking differently: Take 750 mg by mouth every 8 (eight) hours as needed for muscle spasms. ) 30 tablet 0  . metoprolol succinate (TOPROL-XL) 25 MG 24 hr tablet Take 25 mg by mouth daily.    . promethazine (PHENERGAN) 12.5 MG tablet Take 12.5 mg by mouth every 8 (eight) hours as needed for nausea or vomiting.     . SUMAtriptan (IMITREX) 50 MG tablet Take 50 mg by mouth every 2 (two) hours as needed for migraine.     . traMADol (ULTRAM) 50 MG tablet Take 50 mg by mouth every 6 (six) hours as needed for moderate pain.      No facility-administered medications prior to visit.     Review of Systems  Constitutional: Positive for malaise/fatigue. Negative for chills, diaphoresis, fever and weight loss.  HENT: Negative for congestion,  ear pain and sore throat.   Respiratory: Positive for shortness of breath. Negative for cough, hemoptysis, sputum production and wheezing.   Cardiovascular: Positive for chest pain, palpitations and leg swelling.       Restless legs mostly not so much swelling   Gastrointestinal: Negative for abdominal pain, heartburn and nausea.  Genitourinary: Negative for frequency.  Musculoskeletal: Positive for joint pain and myalgias.  Skin: Negative for itching and rash.  Neurological: Positive for dizziness and headaches. Negative for weakness.  Endo/Heme/Allergies: Does not bruise/bleed easily.  Psychiatric/Behavioral: Negative for depression. The patient is nervous/anxious.      Objective:    Vitals:   01/15/19 1036 01/15/19 1039  BP:  96/62  Pulse:  91  Temp: 98.3 F (36.8 C)   TempSrc: Temporal   SpO2:  96%  Weight: 174 lb 9.6 oz (79.2 kg)   Height: 5\' 5"  (1.651 m)    SpO2: 96 % O2 Device: None (Room air) Physical Exam: General: Well-appearing, no acute distress HENT: [place], AT Eyes: EOMI, no scleral icterus Respiratory: Clear to auscultation bilaterally.  No crackles, wheezing or rales Cardiovascular: RRR, -M/R/G, no JVD GI: BS+, soft, nontender Extremities:-Edema,-tenderness Neuro: AAO x4, CNII-XII grossly intact Skin: Intact, no rashes or bruising Psych: Normal mood, normal affect  Data Reviewed:  Imaging: CTA 11/28/18 - No PE. Severe pectus excavatum  PFT: None on file  Labs: CBC    Component Value Date/Time   WBC 12.5 (H) 12/19/2018 1840   RBC 4.44 12/19/2018 1840   HGB 14.3 12/19/2018 1840   HCT 41.2 12/19/2018 1840   PLT 310 12/19/2018 1840   MCV 92.8 12/19/2018 1840   MCH 32.2 12/19/2018 1840   MCHC 34.7 12/19/2018 1840   RDW 12.1 12/19/2018 1840   LYMPHSABS 1.8 11/28/2018 1612   MONOABS 0.4 11/28/2018 1612   EOSABS 0.1 11/28/2018 1612   BASOSABS 0.0 11/28/2018 1612   BMET    Component Value Date/Time   NA 137 12/19/2018 1840   K 3.7 12/19/2018 1840   CL 107 12/19/2018 1840   CO2 21 (L) 12/19/2018 1840   GLUCOSE 95 12/19/2018 1840   BUN 8 12/19/2018 1840   CREATININE 0.63 12/19/2018 1840   CALCIUM 9.3 12/19/2018 1840   GFRNONAA >60 12/19/2018 1840   GFRAA >60 12/19/2018 1840   Imaging, labs and tests noted above have been reviewed independently by me.    Assessment & Plan:   Discussion: 38 year old female with congenital pectus excavatum with recent development of palpitations and anxiety attacks within the last year. Currently she is undergoing evaluation with Cardiology and is scheduled to be seen by Thoracic Surgery with PFTs. We discussed indications for surgical correction of pectus excavatum. Her cardiac symptoms could  possibly be related to her pectus excavatum however could also be a primary cardiac issue. It is unclear if surgery would relieve her cardiac arrhythmias. Would recommend keeping appointment with Thoracic Surgery to further discuss this option. If her PFTs are abnormal, patient has been advised to schedule an appointment with me to discuss results. Otherwise, she can see a Pulmonologist as needed.  Health Maintenance Immunization History  Administered Date(s) Administered  . Tdap 08/01/2013    No orders of the defined types were placed in this encounter. No orders of the defined types were placed in this encounter.   Return if symptoms worsen or fail to improve.  Zoar, MD [facility] 01/15/2019 11:05 AM  Office Number [phone]

## 2019-01-15 NOTE — Patient Instructions (Signed)
Pulmonary Appointment Summary If your pulmonary function tests are abnormal, please make an appointment to see me again.  Otherwise, please call as needed.  Keep the following appointments and tests as scheduled below.

## 2019-01-16 ENCOUNTER — Other Ambulatory Visit (HOSPITAL_COMMUNITY)
Admission: RE | Admit: 2019-01-16 | Discharge: 2019-01-16 | Disposition: A | Discharge status: Home / Self Care | Payer: BC Managed Care – PPO | Source: Ambulatory Visit | Attending: Cardiothoracic Surgery | Admitting: Cardiothoracic Surgery

## 2019-01-16 DIAGNOSIS — Z20828 Contact with and (suspected) exposure to other viral communicable diseases: Secondary | ICD-10-CM | POA: Diagnosis not present

## 2019-01-16 DIAGNOSIS — Z01812 Encounter for preprocedural laboratory examination: Secondary | ICD-10-CM | POA: Diagnosis not present

## 2019-01-16 DIAGNOSIS — I471 Supraventricular tachycardia: Secondary | ICD-10-CM | POA: Diagnosis not present

## 2019-01-17 LAB — NOVEL CORONAVIRUS, NAA (HOSP ORDER, SEND-OUT TO REF LAB; TAT 18-24 HRS): SARS-CoV-2, NAA: NOT DETECTED

## 2019-01-18 DIAGNOSIS — R002 Palpitations: Secondary | ICD-10-CM | POA: Insufficient documentation

## 2019-01-18 DIAGNOSIS — Q676 Pectus excavatum: Secondary | ICD-10-CM | POA: Insufficient documentation

## 2019-01-19 ENCOUNTER — Other Ambulatory Visit: Payer: Self-pay

## 2019-01-19 ENCOUNTER — Ambulatory Visit (HOSPITAL_COMMUNITY)
Admission: RE | Admit: 2019-01-19 | Discharge: 2019-01-19 | Disposition: A | Discharge status: Home / Self Care | Payer: BC Managed Care – PPO | Source: Ambulatory Visit | Attending: Cardiothoracic Surgery | Admitting: Cardiothoracic Surgery

## 2019-01-19 DIAGNOSIS — Q676 Pectus excavatum: Secondary | ICD-10-CM

## 2019-01-19 LAB — PULMONARY FUNCTION TEST
DL/VA % pred: 114 %
DL/VA: 5.07 ml/min/mmHg/L
DLCO unc % pred: 95 %
DLCO unc: 21.77 ml/min/mmHg
FEF 25-75 Post: 3.56 L/sec
FEF 25-75 Pre: 3.15 L/sec
FEF2575-%Change-Post: 13 %
FEF2575-%Pred-Post: 110 %
FEF2575-%Pred-Pre: 97 %
FEV1-%Change-Post: 2 %
FEV1-%Pred-Post: 90 %
FEV1-%Pred-Pre: 88 %
FEV1-Post: 2.84 L
FEV1-Pre: 2.77 L
FEV1FVC-%Change-Post: 0 %
FEV1FVC-%Pred-Pre: 102 %
FEV6-%Change-Post: 2 %
FEV6-%Pred-Post: 89 %
FEV6-%Pred-Pre: 87 %
FEV6-Post: 3.35 L
FEV6-Pre: 3.28 L
FEV6FVC-%Pred-Post: 101 %
FEV6FVC-%Pred-Pre: 101 %
FVC-%Change-Post: 2 %
FVC-%Pred-Post: 87 %
FVC-%Pred-Pre: 85 %
FVC-Post: 3.35 L
FVC-Pre: 3.28 L
Post FEV1/FVC ratio: 85 %
Post FEV6/FVC ratio: 100 %
Pre FEV1/FVC ratio: 84 %
Pre FEV6/FVC Ratio: 100 %
RV % pred: 112 %
RV: 1.81 L
TLC % pred: 98 %
TLC: 5.11 L

## 2019-01-19 MED ORDER — ALBUTEROL SULFATE (2.5 MG/3ML) 0.083% IN NEBU
2.5000 mg | INHALATION_SOLUTION | Freq: Once | RESPIRATORY_TRACT | Status: AC
  Administered 2019-01-19: 2.5 mg via RESPIRATORY_TRACT

## 2019-01-21 ENCOUNTER — Other Ambulatory Visit: Payer: Self-pay | Admitting: Family Medicine

## 2019-01-21 ENCOUNTER — Encounter: Payer: Self-pay | Admitting: *Deleted

## 2019-01-21 DIAGNOSIS — R519 Headache, unspecified: Secondary | ICD-10-CM

## 2019-01-21 DIAGNOSIS — H209 Unspecified iridocyclitis: Secondary | ICD-10-CM

## 2019-01-21 DIAGNOSIS — E559 Vitamin D deficiency, unspecified: Secondary | ICD-10-CM

## 2019-01-21 DIAGNOSIS — Q676 Pectus excavatum: Secondary | ICD-10-CM

## 2019-01-21 DIAGNOSIS — M545 Low back pain, unspecified: Secondary | ICD-10-CM | POA: Insufficient documentation

## 2019-01-21 DIAGNOSIS — F319 Bipolar disorder, unspecified: Secondary | ICD-10-CM | POA: Insufficient documentation

## 2019-01-21 DIAGNOSIS — R079 Chest pain, unspecified: Secondary | ICD-10-CM | POA: Insufficient documentation

## 2019-01-21 DIAGNOSIS — G8929 Other chronic pain: Secondary | ICD-10-CM

## 2019-01-22 ENCOUNTER — Institutional Professional Consult (permissible substitution) (INDEPENDENT_AMBULATORY_CARE_PROVIDER_SITE_OTHER): Payer: BC Managed Care – PPO | Admitting: Cardiothoracic Surgery

## 2019-01-22 ENCOUNTER — Encounter (HOSPITAL_COMMUNITY): Payer: BC Managed Care – PPO

## 2019-01-22 ENCOUNTER — Other Ambulatory Visit: Payer: Self-pay

## 2019-01-22 VITALS — BP 124/76 | HR 52 | Temp 97.3°F | Resp 16 | Ht 65.0 in | Wt 182.4 lb

## 2019-01-22 DIAGNOSIS — Q676 Pectus excavatum: Secondary | ICD-10-CM
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
## 2019-01-26 ENCOUNTER — Encounter: Payer: Self-pay | Admitting: *Deleted

## 2019-02-02 DIAGNOSIS — R002 Palpitations: Secondary | ICD-10-CM | POA: Diagnosis not present

## 2019-02-02 DIAGNOSIS — I493 Ventricular premature depolarization: Secondary | ICD-10-CM | POA: Diagnosis not present

## 2019-02-03 ENCOUNTER — Other Ambulatory Visit: Payer: Self-pay | Admitting: Family Medicine

## 2019-02-07 ENCOUNTER — Other Ambulatory Visit: Payer: Self-pay

## 2019-02-07 ENCOUNTER — Ambulatory Visit
Admission: RE | Admit: 2019-02-07 | Discharge: 2019-02-07 | Disposition: A | Discharge status: Home / Self Care | Payer: BC Managed Care – PPO | Source: Ambulatory Visit | Attending: Family Medicine | Admitting: Family Medicine

## 2019-02-07 DIAGNOSIS — R519 Headache, unspecified: Secondary | ICD-10-CM

## 2019-02-07 DIAGNOSIS — G43909 Migraine, unspecified, not intractable, without status migrainosus: Secondary | ICD-10-CM | POA: Diagnosis not present

## 2019-02-07 DIAGNOSIS — H209 Unspecified iridocyclitis: Secondary | ICD-10-CM

## 2019-02-07 MED ORDER — GADOBENATE DIMEGLUMINE 529 MG/ML IV SOLN
15.0000 mL | Freq: Once | INTRAVENOUS | Status: AC | PRN
  Administered 2019-02-07: 15 mL via INTRAVENOUS

## 2019-02-18 DIAGNOSIS — R931 Abnormal findings on diagnostic imaging of heart and coronary circulation: Secondary | ICD-10-CM | POA: Diagnosis not present

## 2019-02-18 DIAGNOSIS — M1990 Unspecified osteoarthritis, unspecified site: Secondary | ICD-10-CM | POA: Diagnosis not present

## 2019-02-18 DIAGNOSIS — R55 Syncope and collapse: Secondary | ICD-10-CM | POA: Diagnosis not present

## 2019-02-18 DIAGNOSIS — R0789 Other chest pain: Secondary | ICD-10-CM | POA: Diagnosis not present

## 2019-02-18 DIAGNOSIS — F17290 Nicotine dependence, other tobacco product, uncomplicated: Secondary | ICD-10-CM | POA: Diagnosis not present

## 2019-02-18 DIAGNOSIS — F410 Panic disorder [episodic paroxysmal anxiety] without agoraphobia: Secondary | ICD-10-CM | POA: Diagnosis not present

## 2019-02-18 DIAGNOSIS — Q676 Pectus excavatum: Secondary | ICD-10-CM | POA: Diagnosis not present

## 2019-02-18 DIAGNOSIS — R079 Chest pain, unspecified: Secondary | ICD-10-CM | POA: Diagnosis not present

## 2019-02-18 DIAGNOSIS — F419 Anxiety disorder, unspecified: Secondary | ICD-10-CM | POA: Diagnosis not present

## 2019-02-18 DIAGNOSIS — R002 Palpitations: Secondary | ICD-10-CM | POA: Diagnosis not present

## 2019-02-18 DIAGNOSIS — I493 Ventricular premature depolarization: Secondary | ICD-10-CM | POA: Diagnosis not present

## 2019-02-20 DIAGNOSIS — R002 Palpitations: Secondary | ICD-10-CM | POA: Diagnosis not present

## 2019-02-20 DIAGNOSIS — I493 Ventricular premature depolarization: Secondary | ICD-10-CM | POA: Diagnosis not present

## 2019-02-26 NOTE — Progress Notes (Signed)
Cardiology Office Note:    Date:  02/27/2019   ID:  Kayla Gross, DOB 18-Jul-1980, MRN [id number]  PCP:  [facility]  Cardiologist:  Donato Heinz, MD  Electrophysiologist:  None   Referring MD: Jonathon Jordan, MD   Chief Complaint  Patient presents with  . Palpitations    History of Present Illness:    Kayla Gross is a 38 y.o. female with a hx of bipolar disorder who is referred by Dr. Stephanie Acre for an evaluation of palpitations.  She has been following with cardiology with [facility] for chest pain and palpitations.  Symptoms started 3 to 4 months ago.  Reports chest pain occurs all day and describes as pressure in chest.  Also feels like heart is racing, occurs throughout the day.  She states that she feels lightheaded.  She was seen in the ED on 12/19/2018 with near syncope, shortness of breath, and chest pain.  Episode occurred with standing. EKG showed bigeminy.  Troponins negative x2.  Orthostatics reassuring.  Labs unremarkable.  Bigeminy resolved on repeat EKG. she had another ED presentation on 11/28/2018 with pleuritic chest pain.  CTPA showed no PE.  She underwent an echocardiogram at [facility] in [place] on 01/06/2019, which showed normal LV size and systolic/diastolic function, normal wall thickness, normal RV function, no significant valvular disease.  She underwent cardiac catheterization on 02/18/2019 which showed normal coronary arteries, normal LV function and pressure.  Holter monitor showed frequent PVCs, with PVC burden 7.3%.  She was started on Toprol-XL 25 mg daily, which was subsequently increased to 25 mg twice daily.  She reports her palpitations have improved with metoprolol.  She is cut back on caffeine, states most days she does not have any caffeine intake.  She does drink alcohol, states that she has one peer with dinner.  Reports that she vapes, down to once per day.   Past Medical History:  Diagnosis Date  . Bipolar 1 disorder  (HCC)    HX OF ABUSE  . Chronic headache   . Chronic low back pain   . Pectus excavatum   . Vitamin D deficiency     Past Surgical History:  Procedure Laterality Date  . TONSILLECTOMY      Current Medications: Current Meds  Medication Sig  . acetaminophen (TYLENOL) 500 MG tablet Take 1,000 mg by mouth every 6 (six) hours as needed.  . celecoxib (CELEBREX) 200 MG capsule Take 200 mg by mouth daily as needed.  . cyclobenzaprine (FLEXERIL) 10 MG tablet Take 10 mg by mouth 3 (three) times daily as needed for muscle spasms.  Marland Kitchen FLUoxetine (PROZAC) 10 MG tablet Take 10 mg by mouth daily.   Marland Kitchen ibuprofen (ADVIL) 600 MG tablet Take 1 tablet (600 mg total) by mouth every 6 (six) hours as needed. (Patient taking differently: Take 600 mg by mouth every 6 (six) hours as needed for moderate pain. )  . methocarbamol (ROBAXIN-750) 750 MG tablet Take 1 tablet (750 mg total) by mouth 4 (four) times daily. (Patient taking differently: Take 750 mg by mouth every 8 (eight) hours as needed for muscle spasms. )  . metoprolol succinate (TOPROL-XL) 50 MG 24 hr tablet Take 1 tablet (50 mg total) by mouth 2 (two) times daily.  . ondansetron (ZOFRAN) 4 MG tablet Take 4 mg by mouth daily as needed for nausea or vomiting.  . promethazine (PHENERGAN) 25 MG tablet Take 12.5 mg by mouth every 8 (eight) hours as needed for nausea  or vomiting.  . SUMAtriptan (IMITREX) 50 MG tablet Take 50 mg by mouth once. MAY REPEAT AFTER 2 HRS IF SXS NOT RESOLVED  . traMADol (ULTRAM) 50 MG tablet Take 50 mg by mouth every 6 (six) hours as needed for moderate pain.   . [DISCONTINUED] metoprolol succinate (TOPROL-XL) 25 MG 24 hr tablet Take 25 mg by mouth daily.     Allergies:   Patient has no known allergies.   Social History   Socioeconomic History  . Marital status: Married    Spouse name: Not on file  . Number of children: Not on file  . Years of education: Not on file  . Highest education level: Not on file  Occupational  History  . Not on file  Social Needs  . Financial resource strain: Not on file  . Food insecurity    Worry: Not on file    Inability: Not on file  . Transportation needs    Medical: Not on file    Non-medical: Not on file  Tobacco Use  . Smoking status: Former Smoker    Packs/day: 0.25    Years: 19.00    Pack years: 4.75    Types: Cigarettes    Start date: 43    Quit date: 04/09/2012    Years since quitting: 6.8  . Smokeless tobacco: Never Used  Substance and Sexual Activity  . Alcohol use: Yes    Comment: occ, beer/wine/liquor 2 x per week  . Drug use: No  . Sexual activity: Not on file  Lifestyle  . Physical activity    Days per week: Not on file    Minutes per session: Not on file  . Stress: Not on file  Relationships  . Social Herbalist on phone: Not on file    Gets together: Not on file    Attends religious service: Not on file    Active member of club or organization: Not on file    Attends meetings of clubs or organizations: Not on file    Relationship status: Not on file  Other Topics Concern  . Not on file  Social History Narrative  . Not on file     Family History: The patient's family history includes Diabetes in her sister.  ROS:   Please see the history of present illness.     All other systems reviewed and are negative.  EKGs/Labs/Other Studies Reviewed:    The following studies were reviewed today:   EKG:  EKG is  ordered today.  The ekg ordered today demonstrates sinus rhythm with frequent PVCs, poor R wave progression  Recent Labs: 12/19/2018: BUN 8; Creatinine, Ser 0.63; Hemoglobin 14.3; Platelets 310; Potassium 3.7; Sodium 137 12/20/2018: TSH 1.770  Recent Lipid Panel No results found for: CHOL, TRIG, HDL, CHOLHDL, VLDL, LDLCALC, LDLDIRECT  Physical Exam:    VS:  BP 114/73   Pulse 66   Ht 5\' 6"  (1.676 m)   Wt 183 lb 9.6 oz (83.3 kg)   SpO2 98%   BMI 29.63 kg/m     Wt Readings from Last 3 Encounters:  02/27/19 183  lb 9.6 oz (83.3 kg)  01/22/19 182 lb 6.4 oz (82.7 kg)  01/15/19 174 lb 9.6 oz (79.2 kg)     GEN:  Well nourished, well developed in no acute distress HEENT: Normal NECK: No JVD; No carotid bruits LYMPHATICS: No lymphadenopathy CARDIAC: RRR, no murmurs, rubs, gallops RESPIRATORY:  Clear to auscultation without rales, wheezing or rhonchi  ABDOMEN: Soft, non-tender, non-distended MUSCULOSKELETAL:  No edema; No deformity  SKIN: Warm and dry NEUROLOGIC:  Alert and oriented x 3 PSYCHIATRIC:  Normal affect   ASSESSMENT:    1. PVCs (premature ventricular contractions)   2. Palpitations    PLAN:    In order of problems listed above:  PVCs: Having palpitations, which may be related to frequent PVCs.  7.3% of beats on recent Holter.  She is taking Toprol-XL 25 mg twice daily but continues to have frequent palpitations.  Will increase to 50 mg twice daily and recheck Holter to quantify PVC burden on metoprolol.  RTC in 3 months  Medication Adjustments/Labs and Tests Ordered: Current medicines are reviewed at length with the patient today.  Concerns regarding medicines are outlined above.  Orders Placed This Encounter  Procedures  . LONG TERM MONITOR (3-14 DAYS)  . EKG 12-Lead   Meds ordered this encounter  Medications  . metoprolol succinate (TOPROL-XL) 50 MG 24 hr tablet    Sig: Take 1 tablet (50 mg total) by mouth 2 (two) times daily.    Dispense:  180 tablet    Refill:  3    Patient Instructions  Medication Instructions:  Dr Gardiner Rhyme has recommended making the following medication changes: 1. INCREASE Metoprolol succinate to 50 mg twice daily  *If you need a refill on your cardiac medications before your next appointment, please call your pharmacy*  Testing/Procedures: Your physician has recommended that you wear an 3 DAY ZIO-PATCH monitor. The Zio patch cardiac monitor continuously records heart rhythm data for up to 14 days, this is for patients being evaluated for  multiple types heart rhythms. For the first 24 hours post application, please avoid getting the Zio monitor wet in the shower or by excessive sweating during exercise. After that, feel free to carry on with regular activities. Keep soaps and lotions away from the ZIO XT Patch.    This will be placed at our [facility] location - [address], [address].     Follow-Up: At [facility], you and your health needs are our priority.  As part of our continuing mission to provide you with exceptional heart care, we have created designated Provider Care Teams.  These Care Teams include your primary Cardiologist (physician) and Advanced Practice Providers (APPs -  Physician Assistants and Nurse Practitioners) who all work together to provide you with the care you need, when you need it.  Your next appointment:   1 month(s)  The format for your next appointment:   Either In Person or Virtual  Provider:   You may see Donato Heinz, MD or one of the following Advanced Practice Providers on your designated Care Team:    Rosaria Ferries, PA-C  Jory Sims, DNP, ANP  Cadence Kathlen Mody, NP    Signed, Donato Heinz, MD  02/27/2019 5:44 PM    [place]

## 2019-02-27 ENCOUNTER — Ambulatory Visit (INDEPENDENT_AMBULATORY_CARE_PROVIDER_SITE_OTHER): Payer: BC Managed Care – PPO | Admitting: Cardiology

## 2019-02-27 ENCOUNTER — Other Ambulatory Visit: Payer: Self-pay

## 2019-02-27 ENCOUNTER — Encounter: Payer: Self-pay | Admitting: Cardiology

## 2019-02-27 VITALS — BP 114/73 | HR 66 | Ht 66.0 in | Wt 183.6 lb

## 2019-02-27 DIAGNOSIS — R002 Palpitations: Secondary | ICD-10-CM | POA: Diagnosis not present

## 2019-02-27 DIAGNOSIS — I493 Ventricular premature depolarization: Secondary | ICD-10-CM | POA: Diagnosis not present

## 2019-02-27 MED ORDER — METOPROLOL SUCCINATE ER 50 MG PO TB24: 50.0000 mg | ORAL_TABLET | Freq: Two times a day (BID) | ORAL | 3 refills | Status: DC

## 2019-02-27 NOTE — Patient Instructions (Signed)
Medication Instructions:  Dr Gardiner Rhyme has recommended making the following medication changes: 1. INCREASE Metoprolol succinate to 50 mg twice daily  *If you need a refill on your cardiac medications before your next appointment, please call your pharmacy*  Testing/Procedures: Your physician has recommended that you wear an 3 DAY ZIO-PATCH monitor. The Zio patch cardiac monitor continuously records heart rhythm data for up to 14 days, this is for patients being evaluated for multiple types heart rhythms. For the first 24 hours post application, please avoid getting the Zio monitor wet in the shower or by excessive sweating during exercise. After that, feel free to carry on with regular activities. Keep soaps and lotions away from the ZIO XT Patch.    This will be placed at our [facility] location - [address].     Follow-Up: At [facility], you and your health needs are our priority.  As part of our continuing mission to provide you with exceptional heart care, we have created designated Provider Care Teams.  These Care Teams include your primary Cardiologist (physician) and Advanced Practice Providers (APPs -  Physician Assistants and Nurse Practitioners) who all work together to provide you with the care you need, when you need it.  Your next appointment:   1 month(s)  The format for your next appointment:   Either In Person or Virtual  Provider:   You may see Donato Heinz, MD or one of the following Advanced Practice Providers on your designated Care Team:    Rosaria Ferries, PA-C  Jory Sims, DNP, ANP  Cadence Kathlen Mody, NP

## 2019-03-03 DIAGNOSIS — I493 Ventricular premature depolarization: Secondary | ICD-10-CM | POA: Diagnosis not present

## 2019-03-03 DIAGNOSIS — R002 Palpitations: Secondary | ICD-10-CM | POA: Diagnosis not present

## 2019-03-11 ENCOUNTER — Telehealth: Payer: Self-pay | Admitting: Radiology

## 2019-03-11 NOTE — Telephone Encounter (Signed)
Enrolled patient for a 3 day Zio monitor to be mailed to patients home.  

## 2019-03-18 DIAGNOSIS — R079 Chest pain, unspecified: Secondary | ICD-10-CM | POA: Diagnosis not present

## 2019-03-18 DIAGNOSIS — R002 Palpitations: Secondary | ICD-10-CM | POA: Diagnosis not present

## 2019-03-18 DIAGNOSIS — I493 Ventricular premature depolarization: Secondary | ICD-10-CM | POA: Diagnosis not present

## 2019-03-26 NOTE — Progress Notes (Deleted)
Cardiology Office Note:    Date:  03/26/2019   ID:  Kayla Gross, DOB 1980-05-02, MRN [id number]  PCP:  [facility]  Cardiologist:  Donato Heinz, MD  Electrophysiologist:  None   Referring MD: [facility]   No chief complaint on file.   History of Present Illness:    Kayla Gross is a 38 y.o. female with a hx of frequent PVCs, bipolar disorder who presents for follow-up.  She was initially seen on 02/27/19 for an evaluation of palpitations.  She has been following with cardiology with [facility] for chest pain and palpitations.  Symptoms started 3 to 4 months ago.  Reports chest pain occurs all day and describes as pressure in chest.  Also feels like heart is racing, occurs throughout the day.  She states that she feels lightheaded.  She was seen in the ED on 12/19/2018 with near syncope, shortness of breath, and chest pain.  Episode occurred with standing. EKG showed bigeminy.  Troponins negative x2.  Orthostatics reassuring.  Labs unremarkable.  Bigeminy resolved on repeat EKG. she had another ED presentation on 11/28/2018 with pleuritic chest pain.  CTPA showed no PE.  She underwent an echocardiogram at [facility] in [place] on 01/06/2019, which showed normal LV size and systolic/diastolic function, normal wall thickness, normal RV function, no significant valvular disease.  She underwent cardiac catheterization on 02/18/2019 which showed normal coronary arteries, normal LV function and pressure.  Holter monitor showed frequent PVCs, with PVC burden 7.3%.  She was started on Toprol-XL 25 mg daily, which was subsequently increased to 25 mg twice daily.  She reports her palpitations have improved with metoprolol.  She is cut back on caffeine, states most days she does not have any caffeine intake.  She does drink alcohol, states that she has one peer with dinner.  Reports that she vapes, down to once per day.  At clinic visit on 02/27/2019, Toprol-XL  was increased to 50 mg twice daily and Zio patch x3 days was ordered to quantify PVC burden on metoprolol.   Past Medical History:  Diagnosis Date  . Bipolar 1 disorder (HCC)    HX OF ABUSE  . Chronic headache   . Chronic low back pain   . Pectus excavatum   . Vitamin D deficiency     Past Surgical History:  Procedure Laterality Date  . TONSILLECTOMY      Current Medications: No outpatient medications have been marked as taking for the 03/27/19 encounter (Appointment) with Donato Heinz, MD.     Allergies:   Patient has no known allergies.   Social History   Socioeconomic History  . Marital status: Married    Spouse name: Not on file  . Number of children: Not on file  . Years of education: Not on file  . Highest education level: Not on file  Occupational History  . Not on file  Tobacco Use  . Smoking status: Former Smoker    Packs/day: 0.25    Years: 19.00    Pack years: 4.75    Types: Cigarettes    Start date: 14    Quit date: 04/09/2012    Years since quitting: 6.9  . Smokeless tobacco: Never Used  Substance and Sexual Activity  . Alcohol use: Yes    Comment: occ, beer/wine/liquor 2 x per week  . Drug use: No  . Sexual activity: Not on file  Other Topics Concern  . Not on file  Social History Narrative  . Not on file   Social Determinants of Health   Financial Resource Strain:   . Difficulty of Paying Living Expenses: Not on file  Food Insecurity:   . Worried About Charity fundraiser in the Last Year: Not on file  . Ran Out of Food in the Last Year: Not on file  Transportation Needs:   . Lack of Transportation (Medical): Not on file  . Lack of Transportation (Non-Medical): Not on file  Physical Activity:   . Days of Exercise per Week: Not on file  . Minutes of Exercise per Session: Not on file  Stress:   . Feeling of Stress : Not on file  Social Connections:   . Frequency of Communication with Friends and Family: Not on file  .  Frequency of Social Gatherings with Friends and Family: Not on file  . Attends Religious Services: Not on file  . Active Member of Clubs or Organizations: Not on file  . Attends Archivist Meetings: Not on file  . Marital Status: Not on file     Family History: The patient's family history includes Diabetes in her sister.  ROS:   Please see the history of present illness.     All other systems reviewed and are negative.  EKGs/Labs/Other Studies Reviewed:    The following studies were reviewed today:   EKG:  EKG is  ordered today.  The ekg ordered today demonstrates sinus rhythm with frequent PVCs, poor R wave progression  Recent Labs: 12/19/2018: BUN 8; Creatinine, Ser 0.63; Hemoglobin 14.3; Platelets 310; Potassium 3.7; Sodium 137 12/20/2018: TSH 1.770  Recent Lipid Panel No results found for: CHOL, TRIG, HDL, CHOLHDL, VLDL, LDLCALC, LDLDIRECT  Physical Exam:    VS:  There were no vitals taken for this visit.    Wt Readings from Last 3 Encounters:  02/27/19 183 lb 9.6 oz (83.3 kg)  01/22/19 182 lb 6.4 oz (82.7 kg)  01/15/19 174 lb 9.6 oz (79.2 kg)     GEN:  Well nourished, well developed in no acute distress HEENT: Normal NECK: No JVD; No carotid bruits LYMPHATICS: No lymphadenopathy CARDIAC: RRR, no murmurs, rubs, gallops RESPIRATORY:  Clear to auscultation without rales, wheezing or rhonchi  ABDOMEN: Soft, non-tender, non-distended MUSCULOSKELETAL:  No edema; No deformity  SKIN: Warm and dry NEUROLOGIC:  Alert and oriented x 3 PSYCHIATRIC:  Normal affect   ASSESSMENT:    No diagnosis found. PLAN:    In order of problems listed above:  PVCs: Having palpitations, which may be related to frequent PVCs.  7.3% of beats on recent Holter.  She is taking Toprol-XL 25 mg twice daily but continues to have frequent palpitations.  Will increase to 50 mg twice daily and recheck Holter to quantify PVC burden on metoprolol.  RTC in 3 months  Medication  Adjustments/Labs and Tests Ordered: Current medicines are reviewed at length with the patient today.  Concerns regarding medicines are outlined above.  No orders of the defined types were placed in this encounter.  No orders of the defined types were placed in this encounter.   There are no Patient Instructions on file for this visit.   Signed, Donato Heinz, MD  03/26/2019 9:06 PM    [place]

## 2019-03-27 ENCOUNTER — Ambulatory Visit: Payer: BC Managed Care – PPO | Admitting: Cardiology

## 2019-04-01 DIAGNOSIS — Z20828 Contact with and (suspected) exposure to other viral communicable diseases: Secondary | ICD-10-CM | POA: Diagnosis not present

## 2019-04-01 DIAGNOSIS — F17200 Nicotine dependence, unspecified, uncomplicated: Secondary | ICD-10-CM | POA: Diagnosis not present

## 2019-04-01 DIAGNOSIS — I493 Ventricular premature depolarization: Secondary | ICD-10-CM | POA: Diagnosis not present

## 2019-04-01 DIAGNOSIS — Z01812 Encounter for preprocedural laboratory examination: Secondary | ICD-10-CM | POA: Diagnosis not present

## 2019-04-01 DIAGNOSIS — F410 Panic disorder [episodic paroxysmal anxiety] without agoraphobia: Secondary | ICD-10-CM | POA: Diagnosis not present

## 2019-04-01 DIAGNOSIS — E669 Obesity, unspecified: Secondary | ICD-10-CM | POA: Diagnosis not present

## 2019-04-06 DIAGNOSIS — F410 Panic disorder [episodic paroxysmal anxiety] without agoraphobia: Secondary | ICD-10-CM | POA: Diagnosis not present

## 2019-04-06 DIAGNOSIS — F17290 Nicotine dependence, other tobacco product, uncomplicated: Secondary | ICD-10-CM | POA: Diagnosis not present

## 2019-04-06 DIAGNOSIS — K219 Gastro-esophageal reflux disease without esophagitis: Secondary | ICD-10-CM | POA: Diagnosis not present

## 2019-04-06 DIAGNOSIS — Z6830 Body mass index (BMI) 30.0-30.9, adult: Secondary | ICD-10-CM | POA: Diagnosis not present

## 2019-04-06 DIAGNOSIS — R002 Palpitations: Secondary | ICD-10-CM | POA: Diagnosis not present

## 2019-04-06 DIAGNOSIS — F419 Anxiety disorder, unspecified: Secondary | ICD-10-CM | POA: Diagnosis not present

## 2019-04-06 DIAGNOSIS — I493 Ventricular premature depolarization: Secondary | ICD-10-CM | POA: Diagnosis not present

## 2019-04-06 DIAGNOSIS — E669 Obesity, unspecified: Secondary | ICD-10-CM | POA: Diagnosis not present

## 2019-04-10 HISTORY — PX: HERNIA REPAIR: SHX51

## 2019-06-11 ENCOUNTER — Other Ambulatory Visit: Payer: Self-pay

## 2019-06-11 ENCOUNTER — Encounter (HOSPITAL_COMMUNITY): Payer: Self-pay | Admitting: Pharmacy Technician

## 2019-06-11 ENCOUNTER — Emergency Department (HOSPITAL_COMMUNITY)
Admission: EM | Admit: 2019-06-11 | Discharge: 2019-06-11 | Disposition: A | Discharge status: Home / Self Care | Payer: BC Managed Care – PPO | Attending: Emergency Medicine | Admitting: Emergency Medicine

## 2019-06-11 DIAGNOSIS — R2243 Localized swelling, mass and lump, lower limb, bilateral: Secondary | ICD-10-CM | POA: Insufficient documentation

## 2019-06-11 DIAGNOSIS — Z5321 Procedure and treatment not carried out due to patient leaving prior to being seen by health care provider: Secondary | ICD-10-CM | POA: Insufficient documentation

## 2019-06-11 DIAGNOSIS — R202 Paresthesia of skin: Secondary | ICD-10-CM | POA: Insufficient documentation

## 2019-06-11 LAB — BASIC METABOLIC PANEL
Anion gap: 9 (ref 5–15)
BUN: 8 mg/dL (ref 6–20)
CO2: 22 mmol/L (ref 22–32)
Calcium: 9.1 mg/dL (ref 8.9–10.3)
Chloride: 107 mmol/L (ref 98–111)
Creatinine, Ser: 0.73 mg/dL (ref 0.44–1.00)
GFR calc Af Amer: >60 mL/min (ref >60)
GFR calc non Af Amer: >60 mL/min (ref >60)
Glucose, Bld: 91 mg/dL (ref 70–99)
Potassium: 3.9 mmol/L (ref 3.5–5.1)
Sodium: 138 mmol/L (ref 135–145)

## 2019-06-11 LAB — CBC
HCT: 39.4 % (ref 36.0–46.0)
Hemoglobin: 13.1 g/dL (ref 12.0–15.0)
MCH: 31 pg (ref 26.0–34.0)
MCHC: 33.2 g/dL (ref 30.0–36.0)
MCV: 93.1 fL (ref 80.0–100.0)
Platelets: 286 10*3/uL (ref 150–400)
RBC: 4.23 MIL/uL (ref 3.87–5.11)
RDW: 12.3 % (ref 11.5–15.5)
WBC: 6 10*3/uL (ref 4.0–10.5)
nRBC: 0 % (ref 0.0–0.2)

## 2019-06-11 LAB — I-STAT BETA HCG BLOOD, ED (MC, WL, AP ONLY): I-stat hCG, quantitative: <5 m[IU]/mL (ref <5)

## 2019-06-11 NOTE — ED Triage Notes (Addendum)
Pt arrives pov with reports of bil ankle swelling. R>L.  Pt reports tingling and tightness to R thigh. Denies cp, shob. Recent cardiac ablation per pt.

## 2019-06-12 DIAGNOSIS — M7989 Other specified soft tissue disorders: Secondary | ICD-10-CM | POA: Diagnosis not present

## 2019-06-12 DIAGNOSIS — M25471 Effusion, right ankle: Secondary | ICD-10-CM | POA: Diagnosis not present

## 2019-06-12 DIAGNOSIS — F17200 Nicotine dependence, unspecified, uncomplicated: Secondary | ICD-10-CM | POA: Diagnosis not present

## 2019-06-12 DIAGNOSIS — R600 Localized edema: Secondary | ICD-10-CM | POA: Diagnosis not present

## 2019-06-12 DIAGNOSIS — M25472 Effusion, left ankle: Secondary | ICD-10-CM | POA: Diagnosis not present

## 2019-06-12 DIAGNOSIS — R2243 Localized swelling, mass and lump, lower limb, bilateral: Secondary | ICD-10-CM | POA: Diagnosis not present

## 2019-06-17 DIAGNOSIS — M542 Cervicalgia: Secondary | ICD-10-CM | POA: Diagnosis not present

## 2019-06-18 DIAGNOSIS — I493 Ventricular premature depolarization: Secondary | ICD-10-CM | POA: Diagnosis not present

## 2019-06-18 DIAGNOSIS — R002 Palpitations: Secondary | ICD-10-CM | POA: Diagnosis not present

## 2019-06-18 DIAGNOSIS — R079 Chest pain, unspecified: Secondary | ICD-10-CM | POA: Diagnosis not present

## 2019-06-23 DIAGNOSIS — I493 Ventricular premature depolarization: Secondary | ICD-10-CM | POA: Diagnosis not present

## 2019-06-23 DIAGNOSIS — R002 Palpitations: Secondary | ICD-10-CM | POA: Diagnosis not present

## 2019-06-23 DIAGNOSIS — R079 Chest pain, unspecified: Secondary | ICD-10-CM | POA: Diagnosis not present

## 2019-06-30 DIAGNOSIS — Z79899 Other long term (current) drug therapy: Secondary | ICD-10-CM | POA: Diagnosis not present

## 2019-06-30 DIAGNOSIS — F17290 Nicotine dependence, other tobacco product, uncomplicated: Secondary | ICD-10-CM | POA: Diagnosis not present

## 2019-06-30 DIAGNOSIS — H53149 Visual discomfort, unspecified: Secondary | ICD-10-CM | POA: Diagnosis not present

## 2019-06-30 DIAGNOSIS — R112 Nausea with vomiting, unspecified: Secondary | ICD-10-CM | POA: Diagnosis not present

## 2019-06-30 DIAGNOSIS — R519 Headache, unspecified: Secondary | ICD-10-CM | POA: Diagnosis not present

## 2019-07-01 ENCOUNTER — Ambulatory Visit: Payer: BC Managed Care – PPO | Attending: Family Medicine | Admitting: Physical Therapy

## 2019-07-01 ENCOUNTER — Other Ambulatory Visit: Payer: Self-pay

## 2019-07-01 ENCOUNTER — Encounter: Payer: Self-pay | Admitting: Physical Therapy

## 2019-07-01 DIAGNOSIS — M25512 Pain in left shoulder: Secondary | ICD-10-CM | POA: Insufficient documentation

## 2019-07-01 DIAGNOSIS — M542 Cervicalgia: Secondary | ICD-10-CM | POA: Insufficient documentation

## 2019-07-01 DIAGNOSIS — M25511 Pain in right shoulder: Secondary | ICD-10-CM
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
## 2019-07-01 NOTE — Patient Instructions (Signed)
Access Code: [email or web address] URL: [email or web address] Date: 07/01/2019 Prepared by: Almyra Free Doniel Maiello  Exercises Seated Upper Trapezius Stretch - 2 x daily - 7 x weekly - 3 reps - 1 sets - 30-60 sec hold Seated Cervical Retraction - 3 x daily - 7 x weekly - 1 sets - 10 reps - 5 sec hold  Patient Education Trigger Point Dry Needling

## 2019-07-02 ENCOUNTER — Other Ambulatory Visit: Payer: Self-pay

## 2019-07-02 ENCOUNTER — Encounter: Payer: Self-pay | Admitting: Physical Therapy

## 2019-07-02 ENCOUNTER — Ambulatory Visit: Payer: BC Managed Care – PPO | Admitting: Physical Therapy

## 2019-07-02 DIAGNOSIS — K439 Ventral hernia without obstruction or gangrene: Secondary | ICD-10-CM | POA: Diagnosis not present

## 2019-07-02 DIAGNOSIS — M25511 Pain in right shoulder: Secondary | ICD-10-CM | POA: Diagnosis not present

## 2019-07-02 DIAGNOSIS — M542 Cervicalgia: Secondary | ICD-10-CM | POA: Diagnosis not present

## 2019-07-02 DIAGNOSIS — M25512 Pain in left shoulder: Secondary | ICD-10-CM | POA: Diagnosis not present

## 2019-07-02 DIAGNOSIS — G43909 Migraine, unspecified, not intractable, without status migrainosus: Secondary | ICD-10-CM | POA: Diagnosis not present

## 2019-07-02 NOTE — Patient Instructions (Signed)
Access Code: [email or web address] URL: [email or web address] Date: 07/02/2019 Prepared by: Ruben Im  Exercises Seated Upper Trapezius Stretch - 2 x daily - 7 x weekly - 3 reps - 1 sets - 30-60 sec hold Seated Cervical Retraction - 3 x daily - 7 x weekly - 1 sets - 10 reps - 5 sec hold Supine Chin Tuck - 1 x daily - 7 x weekly - 1 sets - 10 reps Supine Suboccipital Release with Tennis Balls - 1 x daily - 7 x weekly - 1 sets - 10 reps Seated Scapular Retraction - 1 x daily - 7 x weekly - 1 sets - 10 reps  Patient Education Trigger Point Dry Needling
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
## 2019-07-09 NOTE — Telephone Encounter (Signed)
[employer] has not received monitor back from patient. They have attempted to contact the patient on 3 occasions to request monitor be returned to [employer].  CANCELLED ORDER 

## 2019-07-14 ENCOUNTER — Ambulatory Visit: Payer: BC Managed Care – PPO | Admitting: Physical Therapy

## 2019-07-16 DIAGNOSIS — R002 Palpitations: Secondary | ICD-10-CM | POA: Diagnosis not present

## 2019-07-16 DIAGNOSIS — I493 Ventricular premature depolarization: Secondary | ICD-10-CM | POA: Diagnosis not present

## 2019-07-16 DIAGNOSIS — R079 Chest pain, unspecified: Secondary | ICD-10-CM | POA: Diagnosis not present

## 2019-07-17 ENCOUNTER — Other Ambulatory Visit: Payer: Self-pay

## 2019-07-17 ENCOUNTER — Ambulatory Visit: Payer: BC Managed Care – PPO | Attending: Family Medicine | Admitting: Physical Therapy

## 2019-07-17 ENCOUNTER — Encounter: Payer: Self-pay | Admitting: Physical Therapy

## 2019-07-17 DIAGNOSIS — M25512 Pain in left shoulder: Secondary | ICD-10-CM | POA: Insufficient documentation

## 2019-07-17 DIAGNOSIS — M25511 Pain in right shoulder: Secondary | ICD-10-CM

## 2019-07-17 DIAGNOSIS — M542 Cervicalgia: Secondary | ICD-10-CM | POA: Diagnosis not present

## 2019-07-17 NOTE — Patient Instructions (Signed)
Access Code: [email or web address] URL: [email or web address] Date: 07/17/2019 Prepared by: Ruben Im  Exercises Seated Upper Trapezius Stretch - 2 x daily - 7 x weekly - 3 reps - 1 sets - 30-60 sec hold Seated Cervical Retraction - 3 x daily - 7 x weekly - 1 sets - 10 reps - 5 sec hold Supine Chin Tuck - 1 x daily - 7 x weekly - 1 sets - 10 reps Supine Suboccipital Release with Tennis Balls - 1 x daily - 7 x weekly - 1 sets - 10 reps Seated Scapular Retraction - 1 x daily - 7 x weekly - 1 sets - 10 reps Supine Shoulder Horizontal Abduction with Resistance - 1 x daily - 7 x weekly - 1 sets - 10 reps Supine PNF D2 Flexion with Resistance - 1 x daily - 7 x weekly - 1 sets - 10 reps Supine Shoulder External Rotation with Resistance - 1 x daily - 7 x weekly - 1 sets - 10 reps  Patient Education Trigger Point Dry Needling
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
## 2019-07-21 ENCOUNTER — Ambulatory Visit: Payer: BC Managed Care – PPO | Admitting: Physical Therapy

## 2019-07-21 ENCOUNTER — Encounter: Payer: Self-pay | Admitting: Physical Therapy

## 2019-07-21 ENCOUNTER — Other Ambulatory Visit: Payer: Self-pay

## 2019-07-21 DIAGNOSIS — M25512 Pain in left shoulder: Secondary | ICD-10-CM

## 2019-07-21 DIAGNOSIS — M25511 Pain in right shoulder: Secondary | ICD-10-CM

## 2019-07-21 DIAGNOSIS — M542 Cervicalgia: Secondary | ICD-10-CM

## 2019-07-21 NOTE — Patient Instructions (Signed)
Access Code: [email or web address] URL: [email or web address] Date: 07/21/2019 Prepared by: Almyra Free Ina Poupard  Exercises Seated Upper Trapezius Stretch - 2 x daily - 7 x weekly - 3 reps - 1 sets - 30-60 sec hold Seated Cervical Retraction - 3 x daily - 7 x weekly - 1 sets - 10 reps - 5 sec hold Supine Chin Tuck - 1 x daily - 7 x weekly - 1 sets - 10 reps Supine Suboccipital Release with Tennis Balls - 1 x daily - 7 x weekly - 1 sets - 10 reps Seated Scapular Retraction - 1 x daily - 7 x weekly - 1 sets - 10 reps Supine Shoulder Horizontal Abduction with Resistance - 1 x daily - 7 x weekly - 1 sets - 10 reps Supine PNF D2 Flexion with Resistance - 1 x daily - 7 x weekly - 1 sets - 10 reps Supine Shoulder External Rotation with Resistance - 1 x daily - 7 x weekly - 1 sets - 10 reps Sidelying Thoracic Rotation with Open Book - 1 x daily - 7 x weekly - 1 sets - 10 reps - 5 sec hold Standing Single Arm Shoulder Shrug - 1 x daily - 7 x weekly - 2 sets - 10 reps - use double green band in door for resistance; arm at 30 deg from body. resistance Mini Squat/Good Morning - 2 x daily - 7 x weekly - 1 sets - 5 reps - 10 sec hold - in above squat, reach both hands forward, palms up, pinkies together until stretch is felt upper back stretch  Patient Education Trigger Point Dry Needling
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
## 2019-07-23 ENCOUNTER — Ambulatory Visit: Payer: BC Managed Care – PPO | Admitting: Physical Therapy

## 2019-07-23 ENCOUNTER — Other Ambulatory Visit: Payer: Self-pay

## 2019-07-23 DIAGNOSIS — M25511 Pain in right shoulder: Secondary | ICD-10-CM | POA: Diagnosis not present

## 2019-07-23 DIAGNOSIS — M25512 Pain in left shoulder: Secondary | ICD-10-CM

## 2019-07-23 DIAGNOSIS — M542 Cervicalgia: Secondary | ICD-10-CM
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
## 2019-07-28 ENCOUNTER — Ambulatory Visit: Payer: BC Managed Care – PPO | Admitting: Physical Therapy

## 2019-07-28 ENCOUNTER — Other Ambulatory Visit: Payer: Self-pay

## 2019-07-28 DIAGNOSIS — M25512 Pain in left shoulder: Secondary | ICD-10-CM | POA: Diagnosis not present

## 2019-07-28 DIAGNOSIS — M542 Cervicalgia: Secondary | ICD-10-CM | POA: Diagnosis not present

## 2019-07-28 DIAGNOSIS — M25511 Pain in right shoulder: Secondary | ICD-10-CM | POA: Diagnosis not present
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
## 2019-07-30 ENCOUNTER — Encounter: Payer: BC Managed Care – PPO | Admitting: Physical Therapy

## 2019-07-30 DIAGNOSIS — E663 Overweight: Secondary | ICD-10-CM | POA: Diagnosis not present

## 2019-07-30 DIAGNOSIS — K439 Ventral hernia without obstruction or gangrene: Secondary | ICD-10-CM | POA: Diagnosis not present

## 2019-08-04 ENCOUNTER — Encounter: Payer: Self-pay | Admitting: Physical Therapy

## 2019-08-04 ENCOUNTER — Ambulatory Visit: Payer: BC Managed Care – PPO | Admitting: Physical Therapy

## 2019-08-04 ENCOUNTER — Other Ambulatory Visit: Payer: Self-pay

## 2019-08-04 DIAGNOSIS — M25512 Pain in left shoulder: Secondary | ICD-10-CM

## 2019-08-04 DIAGNOSIS — M542 Cervicalgia: Secondary | ICD-10-CM | POA: Diagnosis not present

## 2019-08-04 DIAGNOSIS — M25511 Pain in right shoulder: Secondary | ICD-10-CM | POA: Diagnosis not present
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
## 2019-08-06 ENCOUNTER — Ambulatory Visit: Payer: BC Managed Care – PPO | Admitting: Physical Therapy

## 2019-08-06 ENCOUNTER — Other Ambulatory Visit: Payer: Self-pay

## 2019-08-06 ENCOUNTER — Encounter: Payer: Self-pay | Admitting: Physical Therapy

## 2019-08-06 DIAGNOSIS — M25512 Pain in left shoulder: Secondary | ICD-10-CM

## 2019-08-06 DIAGNOSIS — M25511 Pain in right shoulder: Secondary | ICD-10-CM

## 2019-08-06 DIAGNOSIS — M542 Cervicalgia: Secondary | ICD-10-CM | POA: Diagnosis not present
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
## 2019-08-10 ENCOUNTER — Other Ambulatory Visit: Payer: Self-pay | Admitting: General Surgery

## 2019-08-10 DIAGNOSIS — K439 Ventral hernia without obstruction or gangrene: Secondary | ICD-10-CM

## 2019-08-11 ENCOUNTER — Encounter: Payer: Self-pay | Admitting: Physical Therapy

## 2019-08-11 ENCOUNTER — Other Ambulatory Visit: Payer: Self-pay

## 2019-08-11 ENCOUNTER — Ambulatory Visit: Payer: BC Managed Care – PPO | Attending: Family Medicine | Admitting: Physical Therapy

## 2019-08-11 DIAGNOSIS — M25511 Pain in right shoulder: Secondary | ICD-10-CM

## 2019-08-11 DIAGNOSIS — M25512 Pain in left shoulder: Secondary | ICD-10-CM

## 2019-08-11 DIAGNOSIS — M542 Cervicalgia: Secondary | ICD-10-CM | POA: Diagnosis not present
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
## 2019-08-13 ENCOUNTER — Ambulatory Visit: Payer: BC Managed Care – PPO | Admitting: Physical Therapy

## 2019-08-13 ENCOUNTER — Encounter: Payer: Self-pay | Admitting: Physical Therapy

## 2019-08-13 ENCOUNTER — Other Ambulatory Visit: Payer: Self-pay

## 2019-08-13 DIAGNOSIS — E559 Vitamin D deficiency, unspecified: Secondary | ICD-10-CM | POA: Diagnosis not present

## 2019-08-13 DIAGNOSIS — M25511 Pain in right shoulder: Secondary | ICD-10-CM | POA: Diagnosis not present

## 2019-08-13 DIAGNOSIS — Z1589 Genetic susceptibility to other disease: Secondary | ICD-10-CM | POA: Diagnosis not present

## 2019-08-13 DIAGNOSIS — M25512 Pain in left shoulder: Secondary | ICD-10-CM

## 2019-08-13 DIAGNOSIS — Z79899 Other long term (current) drug therapy: Secondary | ICD-10-CM | POA: Diagnosis not present

## 2019-08-13 DIAGNOSIS — I471 Supraventricular tachycardia: Secondary | ICD-10-CM | POA: Diagnosis not present

## 2019-08-13 DIAGNOSIS — M542 Cervicalgia: Secondary | ICD-10-CM | POA: Diagnosis not present

## 2019-08-13 DIAGNOSIS — Z0000 Encounter for general adult medical examination without abnormal findings: Secondary | ICD-10-CM | POA: Diagnosis not present
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
## 2019-08-17 ENCOUNTER — Ambulatory Visit
Admission: RE | Admit: 2019-08-17 | Discharge: 2019-08-17 | Disposition: A | Discharge status: Home / Self Care | Payer: BC Managed Care – PPO | Source: Ambulatory Visit | Attending: General Surgery | Admitting: General Surgery

## 2019-08-17 DIAGNOSIS — Z01818 Encounter for other preprocedural examination: Secondary | ICD-10-CM | POA: Diagnosis not present

## 2019-08-17 DIAGNOSIS — K439 Ventral hernia without obstruction or gangrene: Secondary | ICD-10-CM | POA: Diagnosis not present

## 2019-08-17 MED ORDER — IOPAMIDOL (ISOVUE-300) INJECTION 61%
100.0000 mL | Freq: Once | INTRAVENOUS | Status: AC | PRN
  Administered 2019-08-17: 100 mL via INTRAVENOUS

## 2019-08-18 ENCOUNTER — Encounter: Payer: Self-pay | Admitting: Physical Therapy

## 2019-08-18 ENCOUNTER — Other Ambulatory Visit: Payer: Self-pay

## 2019-08-18 ENCOUNTER — Ambulatory Visit: Payer: BC Managed Care – PPO | Admitting: Physical Therapy

## 2019-08-18 DIAGNOSIS — M25512 Pain in left shoulder: Secondary | ICD-10-CM

## 2019-08-18 DIAGNOSIS — M25511 Pain in right shoulder: Secondary | ICD-10-CM | POA: Diagnosis not present

## 2019-08-18 DIAGNOSIS — M542 Cervicalgia: Secondary | ICD-10-CM
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
## 2019-08-20 ENCOUNTER — Other Ambulatory Visit: Payer: Self-pay

## 2019-08-20 ENCOUNTER — Ambulatory Visit: Payer: BC Managed Care – PPO | Admitting: Physical Therapy

## 2019-08-20 DIAGNOSIS — M25511 Pain in right shoulder: Secondary | ICD-10-CM | POA: Diagnosis not present

## 2019-08-20 DIAGNOSIS — M25512 Pain in left shoulder: Secondary | ICD-10-CM

## 2019-08-20 DIAGNOSIS — M542 Cervicalgia: Secondary | ICD-10-CM
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
## 2019-08-20 NOTE — Patient Instructions (Signed)
Access Code: [email or web address] URL: [email or web address] Date: 07/21/2019 Prepared by: Almyra Free Liliana Brentlinger  Exercises Seated Upper Trapezius Stretch - 2 x daily - 7 x weekly - 3 reps - 1 sets - 30-60 sec hold Supine Suboccipital Release with Tennis Balls - 1 x daily - 7 x weekly - 1 sets - 10 reps Supine Chin Tuck - 1 x daily - 7 x weekly - 1 sets - 10 reps Supine Segmental Cervical Flexion - 1 x daily - 7 x weekly - 3 sets - 10 reps Seated Cervical Retraction - 3 x daily - 7 x weekly - 1 sets - 10 reps - 5 sec hold Seated Scapular Retraction - 1 x daily - 7 x weekly - 1 sets - 10 reps Supine Shoulder Horizontal Abduction with Resistance - 1 x daily - 7 x weekly - 1 sets - 10 reps Supine PNF D2 Flexion with Resistance - 1 x daily - 7 x weekly - 1 sets - 10 reps Supine Shoulder External Rotation with Resistance - 1 x daily - 7 x weekly - 1 sets - 10 reps Sidelying Thoracic Rotation with Open Book - 1 x daily - 7 x weekly - 1 sets - 10 reps - 5 sec hold Standing Single Arm Shoulder Shrug - 1 x daily - 7 x weekly - 2 sets - 10 reps - use double green band in door for resistance; arm at 30 deg from body. resistance Mini Squat/Good Morning - 2 x daily - 7 x weekly - 1 sets - 5 reps - 10 sec hold - in above squat, reach both hands forward, palms up, pinkies together until stretch is felt upper back stretch Prone Shoulder Horizontal Abduction with External Rotation - 1 x daily - 4 x weekly - 3 sets - 10 reps Prone Single Arm Shoulder Y with Dumbbell - 1 x daily - 4 x weekly - 3 sets - 10 reps Prone Shoulder Extension with Dumbbells - 1 x daily - 4 x weekly - 3 sets - 10 reps  Patient Education Trigger Point Dry Needling

## 2019-08-25 ENCOUNTER — Other Ambulatory Visit: Payer: Self-pay

## 2019-08-25 ENCOUNTER — Ambulatory Visit: Payer: BC Managed Care – PPO | Admitting: Physical Therapy

## 2019-08-25 DIAGNOSIS — M542 Cervicalgia: Secondary | ICD-10-CM | POA: Diagnosis not present

## 2019-08-25 DIAGNOSIS — M25512 Pain in left shoulder: Secondary | ICD-10-CM

## 2019-08-25 DIAGNOSIS — M25511 Pain in right shoulder: Secondary | ICD-10-CM | POA: Diagnosis not present

## 2019-08-25 NOTE — Patient Instructions (Signed)
Access Code: [email or web address] URL: [email or web address] Date: 08/25/2019 Prepared by: Ruben Im  Exercises Seated Upper Trapezius Stretch - 2 x daily - 7 x weekly - 3 reps - 1 sets - 30-60 sec hold Supine Suboccipital Release with Tennis Balls - 1 x daily - 7 x weekly - 1 sets - 10 reps Supine Chin Tuck - 1 x daily - 7 x weekly - 1 sets - 10 reps Supine Segmental Cervical Flexion - 1 x daily - 7 x weekly - 3 sets - 10 reps Seated Cervical Retraction - 3 x daily - 7 x weekly - 1 sets - 10 reps - 5 sec hold Seated Scapular Retraction - 1 x daily - 7 x weekly - 1 sets - 10 reps Supine Shoulder Horizontal Abduction with Resistance - 1 x daily - 7 x weekly - 1 sets - 10 reps Supine PNF D2 Flexion with Resistance - 1 x daily - 7 x weekly - 1 sets - 10 reps Supine Shoulder External Rotation with Resistance - 1 x daily - 7 x weekly - 1 sets - 10 reps Sidelying Thoracic Rotation with Open Book - 1 x daily - 7 x weekly - 1 sets - 10 reps - 5 sec hold Standing Single Arm Shoulder Shrug - 1 x daily - 7 x weekly - 2 sets - 10 reps - use double green band in door for resistance; arm at 30 deg from body. resistance Mini Squat/Good Morning - 2 x daily - 7 x weekly - 1 sets - 5 reps - 10 sec hold - in above squat, reach both hands forward, palms up, pinkies together until stretch is felt upper back stretch Prone Shoulder Horizontal Abduction with External Rotation - 1 x daily - 4 x weekly - 3 sets - 10 reps Prone Single Arm Shoulder Y with Dumbbell - 1 x daily - 4 x weekly - 3 sets - 10 reps Prone Shoulder Extension with Dumbbells - 1 x daily - 4 x weekly - 3 sets - 10 reps Doorway Rhomboid Stretch - 1 x daily - 7 x weekly - 1 sets - 3 reps - 20 hold Seated Levator Scapulae Stretch - 1 x daily - 7 x weekly - 1 sets - 3 reps Supine Cross Body Shoulder Stretch - 1 x daily - 7 x weekly - 1 sets - 3 reps - 20 hold Quadruped Rockback Posterior Hip Capsule Stretch - 1 x daily - 7 x weekly - 1 sets - 3  reps - 20 hold  Patient Education Trigger Point Dry Needling
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
## 2019-09-01 ENCOUNTER — Ambulatory Visit: Payer: BC Managed Care – PPO | Admitting: Physical Therapy

## 2019-09-07 DIAGNOSIS — Z13220 Encounter for screening for lipoid disorders: Secondary | ICD-10-CM | POA: Diagnosis not present

## 2019-09-07 DIAGNOSIS — Z114 Encounter for screening for human immunodeficiency virus [HIV]: Secondary | ICD-10-CM | POA: Diagnosis not present

## 2019-09-07 DIAGNOSIS — R519 Headache, unspecified: Secondary | ICD-10-CM | POA: Diagnosis not present

## 2019-09-07 DIAGNOSIS — Z131 Encounter for screening for diabetes mellitus: Secondary | ICD-10-CM | POA: Diagnosis not present

## 2019-09-07 DIAGNOSIS — Z0000 Encounter for general adult medical examination without abnormal findings: Secondary | ICD-10-CM | POA: Diagnosis not present

## 2019-09-09 ENCOUNTER — Ambulatory Visit: Payer: Self-pay | Admitting: General Surgery

## 2019-09-09 NOTE — Progress Notes (Signed)
PCP - Jonathon Jordan Cardiologist - Marina Goodell lov 06-18-19 epic  08-07-19 clearance on chart  PPM/ICD -  Device Orders -  Rep Notified -   Chest x-ray -  EKG - 03-02-19 epic Stress Test -  ECHO -  Cardiac Cath -  CT  abd /pelvis 08-17-19 epic  Sleep Study -  CPAP -   Fasting Blood Sugar -  Checks Blood Sugar _____ times a day  Blood Thinner Instructions: Aspirin Instructions:  ERAS Protcol - PRE-SURGERY Ensure or G2-   COVID TEST-    Anesthesia review: palpitations/ Cardiac ablation  Patient denies shortness of breath, fever, cough and chest pain at PAT appointment   none   All instructions explained to the patient, with a verbal understanding of the material. Patient agrees to go over the instructions while at home for a better understanding. Patient also instructed to self quarantine after being tested for COVID-19. The opportunity to ask questions was provided.

## 2019-09-09 NOTE — Patient Instructions (Addendum)
DUE TO COVID-19 ONLY ONE VISITOR IS ALLOWED TO COME WITH YOU AND STAY IN THE WAITING ROOM ONLY DURING PRE OP AND PROCEDURE DAY OF SURGERY. Two  VISITOR MAY VISIT WITH YOU AFTER SURGERY IN YOUR PRIVATE ROOM DURING VISITING HOURS ONLY!  10-a-8p  YOU NEED TO HAVE A COVID 19 TEST ON__6-10_____ @__0945_____ , THIS TEST MUST BE DONE BEFORE SURGERY, COME  [address], [place] , [address].  ([place]) ONCE YOUR COVID TEST IS COMPLETED, PLEASE BEGIN THE QUARANTINE INSTRUCTIONS AS OUTLINED IN YOUR HANDOUT.                Kayla Gross  09/09/2019   Your procedure is scheduled on:   09-21-19   Report to [facility] Main  Entrance   Report to admitting at     1010 AM     Call this number if you have problems the morning of surgery [phone]    Remember: Do not eat food or drink liquids :After Midnight.   BRUSH YOUR TEETH MORNING OF SURGERY AND RINSE YOUR MOUTH OUT, NO CHEWING GUM CANDY OR MINTS.     Take these medicines the morning of surgery with A SIP OF WATER: metoprolol                                 You may not have any metal on your body including hair pins and              piercings  Do not wear jewelry, make-up, lotions, powders or perfumes, deodorant             Do not wear nail polish on your fingernails.  Do not shave  48 hours prior to surgery.                 Do not bring valuables to the hospital. [place].  Contacts, dentures or bridgework may not be worn into surgery.      Patients discharged the day of surgery will not be allowed to drive home. IF YOU ARE HAVING SURGERY AND GOING HOME THE SAME DAY, YOU MUST HAVE AN ADULT TO DRIVE YOU HOME AND BE WITH YOU FOR 24 HOURS. YOU MAY GO HOME BY TAXI OR UBER OR ORTHERWISE, BUT AN ADULT MUST ACCOMPANY YOU HOME AND STAY WITH YOU FOR 24 HOURS.  Name and phone number of your driver:  Special Instructions: N/A              Please read over the  following fact sheets you were given: _____________________________________________________________________             [facility] - Preparing for Surgery Before surgery, you can play an important role.  Because skin is not sterile, your skin needs to be as free of germs as possible.  You can reduce the number of germs on your skin by washing with CHG (chlorahexidine gluconate) soap before surgery.  CHG is an antiseptic cleaner which kills germs and bonds with the skin to continue killing germs even after washing. Please DO NOT use if you have an allergy to CHG or antibacterial soaps.  If your skin becomes reddened/irritated stop using the CHG and inform your nurse when you arrive at Short Stay. Do not shave (including legs and underarms) for at least  48 hours prior to the first CHG shower.  You may shave your face/neck. Please follow these instructions carefully:  1.  Shower with CHG Soap the night before surgery and the  morning of Surgery.  2.  If you choose to wash your hair, wash your hair first as usual with your  normal  shampoo.  3.  After you shampoo, rinse your hair and body thoroughly to remove the  shampoo.                           4.  Use CHG as you would any other liquid soap.  You can apply chg directly  to the skin and wash                       Gently with a scrungie or clean washcloth.  5.  Apply the CHG Soap to your body ONLY FROM THE NECK DOWN.   Do not use on face/ open                           Wound or open sores. Avoid contact with eyes, ears mouth and genitals (private parts).                       Wash face,  Genitals (private parts) with your normal soap.             6.  Wash thoroughly, paying special attention to the area where your surgery  will be performed.  7.  Thoroughly rinse your body with warm water from the neck down.  8.  DO NOT shower/wash with your normal soap after using and rinsing off  the CHG Soap.                9.  Pat yourself dry with a clean  towel.            10.  Wear clean pajamas.            11.  Place clean sheets on your bed the night of your first shower and do not  sleep with pets. Day of Surgery : Do not apply any lotions/deodorants the morning of surgery.  Please wear clean clothes to the hospital/surgery center.  FAILURE TO FOLLOW THESE INSTRUCTIONS MAY RESULT IN THE CANCELLATION OF YOUR SURGERY PATIENT SIGNATURE_________________________________  NURSE SIGNATURE__________________________________  ________________________________________________________________________

## 2019-09-10 ENCOUNTER — Encounter (HOSPITAL_COMMUNITY)
Admission: RE | Admit: 2019-09-10 | Discharge: 2019-09-10 | Disposition: A | Discharge status: Home / Self Care | Payer: BC Managed Care – PPO | Source: Ambulatory Visit | Attending: General Surgery | Admitting: General Surgery

## 2019-09-10 ENCOUNTER — Encounter (HOSPITAL_COMMUNITY): Payer: Self-pay

## 2019-09-10 ENCOUNTER — Other Ambulatory Visit (HOSPITAL_COMMUNITY): Payer: BC Managed Care – PPO

## 2019-09-10 ENCOUNTER — Other Ambulatory Visit: Payer: Self-pay

## 2019-09-10 DIAGNOSIS — Z01812 Encounter for preprocedural laboratory examination: Secondary | ICD-10-CM | POA: Insufficient documentation

## 2019-09-10 HISTORY — DX: Cardiac arrhythmia, unspecified: I49.9

## 2019-09-10 HISTORY — DX: Anxiety disorder, unspecified: F41.9

## 2019-09-10 HISTORY — DX: Depression, unspecified: F32.A

## 2019-09-10 LAB — CBC WITH DIFFERENTIAL/PLATELET
Abs Immature Granulocytes: 0.02 10*3/uL (ref 0.00–0.07)
Basophils Absolute: 0 10*3/uL (ref 0.0–0.1)
Basophils Relative: 1 %
Eosinophils Absolute: 0.2 10*3/uL (ref 0.0–0.5)
Eosinophils Relative: 2 %
HCT: 42 % (ref 36.0–46.0)
Hemoglobin: 14 g/dL (ref 12.0–15.0)
Immature Granulocytes: 0 %
Lymphocytes Relative: 25 %
Lymphs Abs: 1.9 10*3/uL (ref 0.7–4.0)
MCH: 31 pg (ref 26.0–34.0)
MCHC: 33.3 g/dL (ref 30.0–36.0)
MCV: 92.9 fL (ref 80.0–100.0)
Monocytes Absolute: 0.5 10*3/uL (ref 0.1–1.0)
Monocytes Relative: 7 %
Neutro Abs: 5 10*3/uL (ref 1.7–7.7)
Neutrophils Relative %: 65 %
Platelets: 311 10*3/uL (ref 150–400)
RBC: 4.52 MIL/uL (ref 3.87–5.11)
RDW: 12.6 % (ref 11.5–15.5)
WBC: 7.7 10*3/uL (ref 4.0–10.5)
nRBC: 0 % (ref 0.0–0.2)

## 2019-09-10 LAB — BASIC METABOLIC PANEL
Anion gap: 9 (ref 5–15)
BUN: 15 mg/dL (ref 6–20)
CO2: 26 mmol/L (ref 22–32)
Calcium: 9.4 mg/dL (ref 8.9–10.3)
Chloride: 103 mmol/L (ref 98–111)
Creatinine, Ser: 0.8 mg/dL (ref 0.44–1.00)
GFR calc Af Amer: >60 mL/min (ref >60)
GFR calc non Af Amer: >60 mL/min (ref >60)
Glucose, Bld: 85 mg/dL (ref 70–99)
Potassium: 4.5 mmol/L (ref 3.5–5.1)
Sodium: 138 mmol/L (ref 135–145)

## 2019-09-16 NOTE — Anesthesia Preprocedure Evaluation (Addendum)
Anesthesia Evaluation  Patient identified by MRN, date of birth, ID band Patient awake    Reviewed: Allergy & Precautions, NPO status , Patient's Chart, lab work & pertinent test results  Airway Mallampati: II  TM Distance: >3 FB Neck ROM: Full    Dental no notable dental hx. (+) Dental Advisory Given   Pulmonary neg pulmonary ROS, former smoker,    Pulmonary exam normal        Cardiovascular Pt. on home beta blockers Normal cardiovascular exam+ dysrhythmias   H/o PVCs s/p ablation 03/2019 with improvement, on metoprolol.   Echo 04/270 normal LV systolic function with LV EF 60%.  Normal LV diastolic function.  No significant valvular abnormalities noted.  No pulmonary hypertension.  Nuclear stress test 02/02/2019 with small anterior and apical ischemia, LV EF 54%.  Cardiac Cath 02/18/2019 revealed normal coronary arteries.    Per cardiologist, Dr. Dia Crawford, 08/07/2019, "Patient can proceed with the hernia surgery-low risk for ischemic events."   Neuro/Psych  Headaches, PSYCHIATRIC DISORDERS Anxiety Depression Bipolar Disorder    GI/Hepatic negative GI ROS, Neg liver ROS,   Endo/Other  negative endocrine ROS  Renal/GU negative Renal ROS     Musculoskeletal negative musculoskeletal ROS (+)   Abdominal   Peds  Hematology negative hematology ROS (+)   Anesthesia Other Findings   Reproductive/Obstetrics                           Anesthesia Physical Anesthesia Plan  ASA: II  Anesthesia Plan: General   Post-op Pain Management:    Induction:   PONV Risk Score and Plan: 4 or greater and Ondansetron, Dexamethasone, Midazolam and Scopolamine patch - Pre-op  Airway Management Planned: Oral ETT  Additional Equipment:   Intra-op Plan:   Post-operative Plan: Extubation in OR  Informed Consent: I have reviewed the patients History and Physical, chart, labs and discussed the procedure including the  risks, benefits and alternatives for the proposed anesthesia with the patient or authorized representative who has indicated his/her understanding and acceptance.     Dental advisory given  Plan Discussed with: Anesthesiologist and CRNA  Anesthesia Plan Comments: (H/o PVCs s/p ablation 03/2019 with improvement, on metoprolol.   Echo 08/3662 normal LV systolic function with LV EF 60%.  Normal LV diastolic function.  No significant valvular abnormalities noted.  No pulmonary hypertension.  Nuclear stress test 02/02/2019 with small anterior and apical ischemia, LV EF 54%.  Cardiac Cath 02/18/2019 revealed normal coronary arteries.    Per cardiologist, Dr. Dia Crawford, 08/07/2019, "Patient can proceed with the hernia surgery-low risk for ischemic events.")      Anesthesia Quick Evaluation
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
## 2019-09-17 ENCOUNTER — Telehealth: Payer: Self-pay | Admitting: Physical Therapy

## 2019-09-17 ENCOUNTER — Ambulatory Visit: Payer: BC Managed Care – PPO | Attending: Family Medicine | Admitting: Physical Therapy

## 2019-09-17 ENCOUNTER — Other Ambulatory Visit (HOSPITAL_COMMUNITY)
Admission: RE | Admit: 2019-09-17 | Discharge: 2019-09-17 | Disposition: A | Discharge status: Home / Self Care | Payer: BC Managed Care – PPO | Source: Ambulatory Visit | Attending: General Surgery | Admitting: General Surgery

## 2019-09-17 DIAGNOSIS — Z01812 Encounter for preprocedural laboratory examination: Secondary | ICD-10-CM | POA: Insufficient documentation

## 2019-09-17 DIAGNOSIS — Z20822 Contact with and (suspected) exposure to covid-19: Secondary | ICD-10-CM | POA: Diagnosis not present

## 2019-09-17 LAB — SARS CORONAVIRUS 2 (TAT 6-24 HRS): SARS Coronavirus 2: NEGATIVE

## 2019-09-17 NOTE — Telephone Encounter (Signed)
Pt had covid test yesterday for upcoming surgery and she forgot to call to cancel.  She canceled her final visit as well due to surgery and she agrees to discharge at this time and states will come back if needed in the future.  Gustavus Bryant, PT 09/17/19 9:11 AM

## 2019-09-18 DIAGNOSIS — G43909 Migraine, unspecified, not intractable, without status migrainosus: Secondary | ICD-10-CM | POA: Diagnosis not present

## 2019-09-18 DIAGNOSIS — K121 Other forms of stomatitis: Secondary | ICD-10-CM | POA: Diagnosis not present

## 2019-09-18 DIAGNOSIS — Z0189 Encounter for other specified special examinations: Secondary | ICD-10-CM | POA: Diagnosis not present

## 2019-09-18 DIAGNOSIS — D729 Disorder of white blood cells, unspecified: Secondary | ICD-10-CM | POA: Diagnosis not present

## 2019-09-20 MED ORDER — BUPIVACAINE LIPOSOME 1.3 % IJ SUSP
20.0000 mL | INTRAMUSCULAR | Status: DC
  Filled 2019-09-20: qty 20

## 2019-09-21 ENCOUNTER — Ambulatory Visit (HOSPITAL_COMMUNITY)
Admission: RE | Admit: 2019-09-21 | Discharge: 2019-09-21 | Disposition: A | Discharge status: Home / Self Care | Payer: BC Managed Care – PPO | Source: Other Acute Inpatient Hospital | Attending: General Surgery | Admitting: General Surgery

## 2019-09-21 ENCOUNTER — Ambulatory Visit (HOSPITAL_COMMUNITY): Payer: BC Managed Care – PPO | Admitting: Anesthesiology

## 2019-09-21 ENCOUNTER — Ambulatory Visit (HOSPITAL_COMMUNITY): Payer: BC Managed Care – PPO | Admitting: Physician Assistant

## 2019-09-21 ENCOUNTER — Other Ambulatory Visit: Payer: Self-pay

## 2019-09-21 ENCOUNTER — Encounter (HOSPITAL_COMMUNITY)
Admission: RE | Disposition: A | Discharge status: Home / Self Care | Payer: Self-pay | Source: Other Acute Inpatient Hospital | Attending: General Surgery

## 2019-09-21 ENCOUNTER — Encounter (HOSPITAL_COMMUNITY): Payer: Self-pay | Admitting: General Surgery

## 2019-09-21 DIAGNOSIS — E663 Overweight: Secondary | ICD-10-CM | POA: Diagnosis not present

## 2019-09-21 DIAGNOSIS — I493 Ventricular premature depolarization: Secondary | ICD-10-CM | POA: Insufficient documentation

## 2019-09-21 DIAGNOSIS — Z87891 Personal history of nicotine dependence: Secondary | ICD-10-CM | POA: Diagnosis not present

## 2019-09-21 DIAGNOSIS — Z79899 Other long term (current) drug therapy: Secondary | ICD-10-CM | POA: Insufficient documentation

## 2019-09-21 DIAGNOSIS — G43909 Migraine, unspecified, not intractable, without status migrainosus: Secondary | ICD-10-CM | POA: Insufficient documentation

## 2019-09-21 DIAGNOSIS — K439 Ventral hernia without obstruction or gangrene: Secondary | ICD-10-CM | POA: Insufficient documentation

## 2019-09-21 DIAGNOSIS — Z6826 Body mass index (BMI) 26.0-26.9, adult: Secondary | ICD-10-CM | POA: Diagnosis not present

## 2019-09-21 DIAGNOSIS — E559 Vitamin D deficiency, unspecified: Secondary | ICD-10-CM | POA: Diagnosis not present

## 2019-09-21 DIAGNOSIS — Z791 Long term (current) use of non-steroidal anti-inflammatories (NSAID): Secondary | ICD-10-CM | POA: Diagnosis not present

## 2019-09-21 DIAGNOSIS — F319 Bipolar disorder, unspecified: Secondary | ICD-10-CM | POA: Diagnosis not present

## 2019-09-21 DIAGNOSIS — F418 Other specified anxiety disorders: Secondary | ICD-10-CM | POA: Diagnosis not present

## 2019-09-21 LAB — PREGNANCY, URINE: Preg Test, Ur: NEGATIVE

## 2019-09-21 SURGERY — REPAIR, HERNIA, UMBILICAL, ROBOT-ASSISTED
Anesthesia: General

## 2019-09-21 MED ORDER — LIDOCAINE 2% (20 MG/ML) 5 ML SYRINGE
INTRAMUSCULAR | Status: AC
  Filled 2019-09-21: qty 10

## 2019-09-21 MED ORDER — FENTANYL CITRATE (PF) 100 MCG/2ML IJ SOLN
INTRAMUSCULAR | Status: AC
  Administered 2019-09-21: 50 ug via INTRAVENOUS
  Filled 2019-09-21: qty 2

## 2019-09-21 MED ORDER — FENTANYL CITRATE (PF) 100 MCG/2ML IJ SOLN
INTRAMUSCULAR | Status: AC
  Filled 2019-09-21: qty 2

## 2019-09-21 MED ORDER — ACETAMINOPHEN 500 MG PO TABS
1000.0000 mg | ORAL_TABLET | ORAL | Status: AC
  Administered 2019-09-21: 1000 mg via ORAL

## 2019-09-21 MED ORDER — ORAL CARE MOUTH RINSE: 15.0000 mL | Freq: Once | OROMUCOSAL | Status: AC

## 2019-09-21 MED ORDER — ROCURONIUM BROMIDE 10 MG/ML (PF) SYRINGE
PREFILLED_SYRINGE | INTRAVENOUS | Status: AC
  Filled 2019-09-21: qty 10

## 2019-09-21 MED ORDER — FENTANYL CITRATE (PF) 100 MCG/2ML IJ SOLN
INTRAMUSCULAR | Status: DC | PRN
  Administered 2019-09-21 (×2): 50 ug via INTRAVENOUS

## 2019-09-21 MED ORDER — MIDAZOLAM HCL 2 MG/2ML IJ SOLN
INTRAMUSCULAR | Status: AC
  Filled 2019-09-21: qty 2

## 2019-09-21 MED ORDER — LACTATED RINGERS IV SOLN: INTRAVENOUS | Status: DC

## 2019-09-21 MED ORDER — ONDANSETRON HCL 4 MG/2ML IJ SOLN
INTRAMUSCULAR | Status: AC
  Filled 2019-09-21: qty 2

## 2019-09-21 MED ORDER — PROPOFOL 10 MG/ML IV BOLUS
INTRAVENOUS | Status: DC | PRN
  Administered 2019-09-21: 200 mg via INTRAVENOUS

## 2019-09-21 MED ORDER — OXYCODONE HCL 5 MG PO TABS
5.0000 mg | ORAL_TABLET | Freq: Four times a day (QID) | ORAL | 0 refills | Status: DC | PRN
Start: 2019-09-21 — End: 2020-12-01

## 2019-09-21 MED ORDER — BUPIVACAINE HCL 0.25 % IJ SOLN
INTRAMUSCULAR | Status: AC
  Filled 2019-09-21: qty 1

## 2019-09-21 MED ORDER — DEXAMETHASONE SODIUM PHOSPHATE 10 MG/ML IJ SOLN
INTRAMUSCULAR | Status: DC | PRN
  Administered 2019-09-21: 10 mg via INTRAVENOUS

## 2019-09-21 MED ORDER — CHLORHEXIDINE GLUCONATE 0.12 % MT SOLN
15.0000 mL | Freq: Once | OROMUCOSAL | Status: AC
  Administered 2019-09-21: 15 mL via OROMUCOSAL

## 2019-09-21 MED ORDER — ACETAMINOPHEN 500 MG PO TABS
1000.0000 mg | ORAL_TABLET | Freq: Once | ORAL | Status: AC
  Filled 2019-09-21: qty 2

## 2019-09-21 MED ORDER — ONDANSETRON HCL 4 MG/2ML IJ SOLN
INTRAMUSCULAR | Status: DC | PRN
  Administered 2019-09-21: 4 mg via INTRAVENOUS

## 2019-09-21 MED ORDER — CEFAZOLIN SODIUM-DEXTROSE 2-4 GM/100ML-% IV SOLN
2.0000 g | INTRAVENOUS | Status: AC
  Administered 2019-09-21: 2 g via INTRAVENOUS
  Filled 2019-09-21: qty 100

## 2019-09-21 MED ORDER — FENTANYL CITRATE (PF) 250 MCG/5ML IJ SOLN
INTRAMUSCULAR | Status: DC | PRN
  Administered 2019-09-21 (×3): 50 ug via INTRAVENOUS
  Administered 2019-09-21: 100 ug via INTRAVENOUS

## 2019-09-21 MED ORDER — PROPOFOL 10 MG/ML IV BOLUS
INTRAVENOUS | Status: AC
  Filled 2019-09-21: qty 20

## 2019-09-21 MED ORDER — MIDAZOLAM HCL 5 MG/5ML IJ SOLN
INTRAMUSCULAR | Status: DC | PRN
  Administered 2019-09-21: 2 mg via INTRAVENOUS

## 2019-09-21 MED ORDER — FENTANYL CITRATE (PF) 100 MCG/2ML IJ SOLN
25.0000 ug | INTRAMUSCULAR | Status: DC | PRN
  Administered 2019-09-21 (×2): 50 ug via INTRAVENOUS

## 2019-09-21 MED ORDER — CHLORHEXIDINE GLUCONATE CLOTH 2 % EX PADS: 6.0000 | MEDICATED_PAD | Freq: Once | CUTANEOUS | Status: DC

## 2019-09-21 MED ORDER — LIDOCAINE 20MG/ML (2%) 15 ML SYRINGE OPTIME
INTRAMUSCULAR | Status: DC | PRN
  Administered 2019-09-21: 1.5 mg/kg/h via INTRAVENOUS

## 2019-09-21 MED ORDER — 0.9 % SODIUM CHLORIDE (POUR BTL) OPTIME
TOPICAL | Status: DC | PRN
  Administered 2019-09-21: 1000 mL

## 2019-09-21 MED ORDER — BUPIVACAINE LIPOSOME 1.3 % IJ SUSP
INTRAMUSCULAR | Status: DC | PRN
  Administered 2019-09-21: 20 mL

## 2019-09-21 MED ORDER — CELECOXIB 200 MG PO CAPS
200.0000 mg | ORAL_CAPSULE | Freq: Once | ORAL | Status: AC
  Administered 2019-09-21: 200 mg via ORAL
  Filled 2019-09-21: qty 1

## 2019-09-21 MED ORDER — OXYCODONE HCL 5 MG PO TABS: 5.0000 mg | ORAL_TABLET | Freq: Once | ORAL | Status: AC | PRN

## 2019-09-21 MED ORDER — SCOPOLAMINE 1 MG/3DAYS TD PT72
1.0000 | MEDICATED_PATCH | TRANSDERMAL | Status: DC
  Administered 2019-09-21: 1.5 mg via TRANSDERMAL
  Filled 2019-09-21: qty 1

## 2019-09-21 MED ORDER — GABAPENTIN 300 MG PO CAPS
300.0000 mg | ORAL_CAPSULE | ORAL | Status: AC
  Administered 2019-09-21: 300 mg via ORAL
  Filled 2019-09-21: qty 1

## 2019-09-21 MED ORDER — LIDOCAINE 2% (20 MG/ML) 5 ML SYRINGE
INTRAMUSCULAR | Status: AC
  Filled 2019-09-21: qty 5

## 2019-09-21 MED ORDER — LIDOCAINE HCL (CARDIAC) PF 100 MG/5ML IV SOSY
PREFILLED_SYRINGE | INTRAVENOUS | Status: DC | PRN
  Administered 2019-09-21: 100 mg via INTRAVENOUS

## 2019-09-21 MED ORDER — DEXAMETHASONE SODIUM PHOSPHATE 10 MG/ML IJ SOLN
INTRAMUSCULAR | Status: AC
  Filled 2019-09-21: qty 1

## 2019-09-21 MED ORDER — KETOROLAC TROMETHAMINE 15 MG/ML IJ SOLN
15.0000 mg | INTRAMUSCULAR | Status: AC
  Administered 2019-09-21: 15 mg via INTRAVENOUS
  Filled 2019-09-21: qty 1

## 2019-09-21 MED ORDER — KETAMINE HCL 10 MG/ML IJ SOLN
INTRAMUSCULAR | Status: DC | PRN
  Administered 2019-09-21: 30 mg via INTRAVENOUS

## 2019-09-21 MED ORDER — ROCURONIUM BROMIDE 100 MG/10ML IV SOLN
INTRAVENOUS | Status: DC | PRN
  Administered 2019-09-21 (×2): 10 mg via INTRAVENOUS
  Administered 2019-09-21: 90 mg via INTRAVENOUS
  Administered 2019-09-21 (×2): 10 mg via INTRAVENOUS

## 2019-09-21 MED ORDER — FENTANYL CITRATE (PF) 250 MCG/5ML IJ SOLN
INTRAMUSCULAR | Status: AC
  Filled 2019-09-21: qty 5

## 2019-09-21 MED ORDER — SUGAMMADEX SODIUM 200 MG/2ML IV SOLN
INTRAVENOUS | Status: DC | PRN
  Administered 2019-09-21: 200 mg via INTRAVENOUS

## 2019-09-21 MED ORDER — BUPIVACAINE HCL 0.25 % IJ SOLN
INTRAMUSCULAR | Status: DC | PRN
  Administered 2019-09-21: 30 mL

## 2019-09-21 MED ORDER — PROMETHAZINE HCL 25 MG/ML IJ SOLN: 6.2500 mg | INTRAMUSCULAR | Status: DC | PRN

## 2019-09-21 MED ORDER — OXYCODONE HCL 5 MG PO TABS
ORAL_TABLET | ORAL | Status: AC
  Administered 2019-09-21: 5 mg via ORAL
  Filled 2019-09-21: qty 1

## 2019-09-21 SURGICAL SUPPLY — 76 items
APPLICATOR COTTON TIP 6 STRL (MISCELLANEOUS) ×2 IMPLANT
APPLICATOR COTTON TIP 6IN STRL (MISCELLANEOUS) ×4
APPLIER CLIP 5 13 M/L LIGAMAX5 (MISCELLANEOUS)
APPLIER CLIP ROT 10 11.4 M/L (STAPLE)
BLADE SURG SZ11 CARB STEEL (BLADE) ×2 IMPLANT
BNDG ADH 1X3 SHEER STRL LF (GAUZE/BANDAGES/DRESSINGS) ×6 IMPLANT
CANNULA REDUC XI 12-8 STAPL (CANNULA) ×1
CANNULA REDUCER 12-8 DVNC XI (CANNULA) ×1 IMPLANT
CHLORAPREP W/TINT 26 (MISCELLANEOUS) ×2 IMPLANT
CLIP APPLIE 5 13 M/L LIGAMAX5 (MISCELLANEOUS) IMPLANT
CLIP APPLIE ROT 10 11.4 M/L (STAPLE) IMPLANT
CLIP VESOLOCK LG 6/CT PURPLE (CLIP) IMPLANT
CLSR STERI-STRIP ANTIMIC 1/2X4 (GAUZE/BANDAGES/DRESSINGS) ×2 IMPLANT
COVER TIP SHEARS 8 DVNC (MISCELLANEOUS) ×1 IMPLANT
COVER TIP SHEARS 8MM DA VINCI (MISCELLANEOUS) ×1
COVER WAND RF STERILE (DRAPES) ×2 IMPLANT
DECANTER SPIKE VIAL GLASS SM (MISCELLANEOUS) ×2 IMPLANT
DERMABOND ADVANCED (GAUZE/BANDAGES/DRESSINGS)
DERMABOND ADVANCED .7 DNX12 (GAUZE/BANDAGES/DRESSINGS) IMPLANT
DEVICE TROCAR PUNCTURE CLOSURE (ENDOMECHANICALS) IMPLANT
DRAPE ARM DVNC X/XI (DISPOSABLE) ×4 IMPLANT
DRAPE COLUMN DVNC XI (DISPOSABLE) ×1 IMPLANT
DRAPE DA VINCI XI ARM (DISPOSABLE) ×4
DRAPE DA VINCI XI COLUMN (DISPOSABLE) ×1
DRAPE WARM FLUID 44X44 (DRAPES) IMPLANT
ELECT REM PT RETURN 15FT ADLT (MISCELLANEOUS) ×2 IMPLANT
GAUZE 4X4 16PLY RFD (DISPOSABLE) IMPLANT
GAUZE SPONGE 2X2 8PLY STRL LF (GAUZE/BANDAGES/DRESSINGS) IMPLANT
GLOVE BIO SURGEON STRL SZ 6 (GLOVE) IMPLANT
GLOVE BIO SURGEON STRL SZ7.5 (GLOVE) ×2 IMPLANT
GLOVE INDICATOR 8.0 STRL GRN (GLOVE) ×4 IMPLANT
GOWN STRL REUS W/TWL LRG LVL3 (GOWN DISPOSABLE) IMPLANT
GOWN STRL REUS W/TWL XL LVL3 (GOWN DISPOSABLE) ×4 IMPLANT
GRASPER SUT TROCAR 14GX15 (MISCELLANEOUS) ×2 IMPLANT
IRRIGATOR SUCT 8 DISP DVNC XI (IRRIGATION / IRRIGATOR) IMPLANT
IRRIGATOR SUCTION 8MM XI DISP (IRRIGATION / IRRIGATOR)
KIT BASIN (CUSTOM PROCEDURE TRAY) ×2 IMPLANT
KIT TURNOVER KIT A (KITS) IMPLANT
MANIFOLD NEPTUNE II (INSTRUMENTS) ×2 IMPLANT
MARKER SKIN DUAL TIP RULER LAB (MISCELLANEOUS) ×2 IMPLANT
MESH VENTRALIGHT ST 6IN CRC (Mesh General) ×2 IMPLANT
NEEDLE HYPO 22GX1.5 SAFETY (NEEDLE) ×2 IMPLANT
PACK CARDIOVASCULAR III (CUSTOM PROCEDURE TRAY) ×2 IMPLANT
PENCIL SMOKE EVACUATOR (MISCELLANEOUS) IMPLANT
SCISSORS LAP 5X35 DISP (ENDOMECHANICALS) IMPLANT
SEAL CANN UNIV 5-8 DVNC XI (MISCELLANEOUS) ×3 IMPLANT
SEAL XI 5MM-8MM UNIVERSAL (MISCELLANEOUS) ×3
SEALER VESSEL DA VINCI XI (MISCELLANEOUS)
SEALER VESSEL EXT DVNC XI (MISCELLANEOUS) IMPLANT
SET IRRIG TUBING LAPAROSCOPIC (IRRIGATION / IRRIGATOR) IMPLANT
SOL ANTI FOG 6CC (MISCELLANEOUS) ×1 IMPLANT
SOLUTION ANTI FOG 6CC (MISCELLANEOUS) ×1
SOLUTION ELECTROLUBE (MISCELLANEOUS) ×2 IMPLANT
SPONGE GAUZE 2X2 STER 10/PKG (GAUZE/BANDAGES/DRESSINGS)
SPONGE LAP 18X18 RF (DISPOSABLE) ×2 IMPLANT
STAPLER CANNULA SEAL DVNC XI (STAPLE) ×1 IMPLANT
STAPLER CANNULA SEAL XI (STAPLE) ×1
SUT DVC VLOC 180 2-0 12IN GS21 (SUTURE) ×6
SUT MNCRL AB 4-0 PS2 18 (SUTURE) ×2 IMPLANT
SUT SILK 3 0 (SUTURE) ×1
SUT SILK 3-0 KS 30XBRD (SUTURE) ×1 IMPLANT
SUT STRATAFIX 1PDS 45CM VIOLET (SUTURE) IMPLANT
SUT STRATAFIX PDS 30 CT-1 (SUTURE) ×4 IMPLANT
SUT VIC AB 3-0 SH 27 (SUTURE)
SUT VIC AB 3-0 SH 27XBRD (SUTURE) IMPLANT
SUT VICRYL 0 UR6 27IN ABS (SUTURE) ×4 IMPLANT
SUT VLOC 180 3-0 9IN GS21 (SUTURE) IMPLANT
SUTURE DVC VL 180 2-0 12INGS21 (SUTURE) ×3 IMPLANT
SYR CONTROL 10ML LL (SYRINGE) ×2 IMPLANT
SYS RETRIEVAL 5MM INZII UNIV (BASKET) ×2
SYSTEM RETRIEVL 5MM INZII UNIV (BASKET) ×1 IMPLANT
TOWEL OR 17X26 10 PK STRL BLUE (TOWEL DISPOSABLE) ×2 IMPLANT
TOWEL OR NON WOVEN STRL DISP B (DISPOSABLE) ×2 IMPLANT
TRAY FOLEY MTR SLVR 16FR STAT (SET/KITS/TRAYS/PACK) IMPLANT
TROCAR BLADELESS OPT 5 100 (ENDOMECHANICALS) IMPLANT
TUBING INSUFFLATION 10FT LAP (TUBING) ×2 IMPLANT

## 2019-09-21 NOTE — Op Note (Signed)
Kayla Gross, VENUTI MEDICAL RECORD FY:[id number] ACCOUNT [id number] DATE OF BIRTH:08-Aug-1980 FACILITY: [facility] LOCATION: [facility] PHYSICIAN:Rishita Petron [name], MD  OPERATIVE REPORT  DATE OF PROCEDURE:  09/21/2019  PREOPERATIVE DIAGNOSIS:  Ventral hernia x2, supraumbilical as well as epigastric.  POSTOPERATIVE DIAGNOSIS:  Ventral hernia x2, supraumbilical as well as epigastric.  PROCEDURE:  Xi robotic ventral hernia repair x2 with mesh.  SURGEON:  Greer Pickerel, MD  ASSISTANT SURGEON:  Nadeen Landau, MD   ANESTHESIA:  General.  ESTIMATED BLOOD LOSS:  20 mL.  SPECIMENS:  Herniated fat, which was discarded.  FINDINGS:  The patient had 2 fascial defects, one in the epigastric location and one just at and slightly above the umbilicus.  Both had herniated pieces of preperitoneal fat.  The fascial defect at each location was approximately 1.2 to 1.4 cm.  The  defects were closed with a running continuous 0Stratafix suture on a CT1 needle.  Then, a round piece of Bard Ventralight ST mesh was then sutured to the abdominal wall in a circumferential manner using several 2-0 V-Loc sutures.  INDICATIONS:  The patient is a pleasant 39 year old female who had a symptomatic upper abdominal bulge.  On exam, it was consistent with an epigastric hernia.  A CT scan was performed to help delineate the exact size of the fascial dimension as it was  hard to tell on physical exam.  On CT imaging, it did confirm an epigastric hernia with a herniated piece of preperitoneal fat.  The fascial defect on CT measured about 1.2 to 1.3 cm.  There was an intact segment of fascia inferior to that and there was  another fascial defect at or slightly above the umbilicus, again measuring about 1.3 cm.  Based on this, I discussed a minimally invasive approach with a primary muscle repair with a mesh underlay.  We discussed the risks and benefits of the procedure.   She elected to proceed to surgery.  DESCRIPTION OF  PROCEDURE:  After obtaining informed consent and the patient was given oral Tylenol and gabapentin, the was taken to OR #2 at [facility] and placed supine on the operating room table.  Sequential compression devices were placed.   Her arms were tucked at her side with the appropriate padding.  A large jelly roll was put underneath her left side to bump up that side and then the table was flexed to open up the space between the anterior superior iliac spine and the left subcostal  region.  Her abdomen was prepped and draped in the usual standard surgical fashion.  A surgical timeout was performed.  She received IV antibiotic prior to skin incision.  A small stab incision was made in the left lateral upper quadrant just below the costal margin, slightly off of the midclavicular line more laterally.  The Veress needle was introduced.  On the first pass, it was not intraabdominal.  We had a negative  saline drop test.  I then reinserted the Veress needle and had a positive saline drop test.  CO2 insufflation tubing was connected and we had good initial pressure of 3 mmHg and we had symmetric insufflation of the abdomen.  The Veress needle was removed  and the incision slightly enlarged and an 8 mm robotic trocar was advanced into the abdominal cavity.  The endoscope was then placed and the abdominal cavity was surveilled.  There was no evidence of injury to surrounding structures.  An 8 mm trocar was  placed about 2 cm medial and  above the left anterior superior iliac spine and another 8 mm trocar was placed in between the 2 previously placed trocars in the left lateral abdominal wall, almost in the midaxillary line under direct visualization.  The 8  mm trocar underneath the left subcostal margin was upsized to a 12 mm trocar and an 8 mm inner sheath was placed.  The robot was then brought and deployed and the arms were connected to the trocars.  The endoscope was advanced into the middle trocar and   the anatomy was targeted.  The remaining robotic arms were then connected to the other 2 trocars.  A fenestrated bipolar was placed in arm 2 and a  hook electrocautery  was placed in arm 4.  I then scrubbed out and went to the surgeon console while my  assistant stayed at the bedside.  Visual inspection of the abdominal wall revealed only 2 fascial defects as shown on CT imaging.  There was the epigastric hernia as well as the fascial defect just at or slightly above the umbilicus.  There was intact fascia of about 1.5 to 2 cm between  the 2 fascial defects.  There was preperitoneal herniated fat in both defects.  Using the fenestrated grasper, I retracted using hook electrocautery.  I freed it from surrounding structures.  There was a fair amount of preperitoneal herniated fat, which  was reduced.  I then took down the falciform ligament using a combination of hook cautery and fenestrated bipolar.  There was some bleeding from the anterior abdominal wall and the preperitoneal tissue.  Hemostasis was achieved with a fenestrated  bipolar.  In a similar fashion, I reduced the herniated preperitoneal fat from the umbilical fascial defect.  I ended up bringing down the peritoneum between the 2 fascial defects because of fairly thick preperitoneal fat pad between the two.  This allowed me  to see the fascia more clearly.  At this time, my assistant inserted a 0 Stratafix suture on a CT1 needle, 12-inch long and he then exchanged my hook for a needle driver.  Starting just below the umbilicus, I started closing the defect horizontally and  then started sewing from left to right of the screen, starting at the umbilical fascial defect all the way up toward the epigastric hernia.  I did continue the suture even through the normal intact fascia between the 2 fascial defects.  My needle driver  did cut the suture just above the fascial closure of the epigastric hernia.  I had not had an opportunity to run it back  several throws to lock it into place.  My assistant then removed that needle and gave me another 0 Stratafix suture and I started at  the supraumbilical hernia and ran that suture all the way back down inferiorly.  It should be noted that prior to closing the fascia, I lowered the intraabdominal pressure to 10 mmHg.  The Stratafix was then run back several throws to lock the suture in  position.  The fascia was well approximated.  There was no remaining fascial defect that was open.  That suture was then removed.  Measuring, even though each hernia defect was only of about 1.5 cm wide, there was intact fascia between the two.  I chose  a single piece of mesh to cover both defects.  When properly measured for 5 cm overlap, that left [place] with approximately a 15 cm need for mesh coverage.  My assistant obtained a round Bard Ventralight ST mesh.  He marked the midpoint or the center point  of the mesh, as well as the 12 o'clock and 6 o'clock positions.  He then rolled the mesh real tight and placed it through the 12 mm trocar into the abdominal cavity.  He then replaced it with a needle driver.  He then used a 0 silk Keith needle to pass  it through the midportion of the midline in a space equidistant in the center of the abdomen.  I then passed a Lanny Hurst needle through the mesh, back up through the mesh, back up through the anterior abdominal wall and the needle was cut and he used a  hemostat to secure the mesh to the anterior abdominal wall centrally.  This kept the mesh flush against the abdominal wall; however, there was a little bit of folding of the mesh along the edges.  He then placed two 2-0 V-Loc 12-inch long sutures into  the abdominal cavity.  One of the sutures that was not going to be used initially was hooked into the peritoneum.  Since the mesh was sort of  hanging down from the abdominal wall at the apex as well as the inferior edge at the 6 o'clock position, I  placed the V-Loc suture at the 12  o'clock position above the epigastric closure and anchored it to the peritoneum and took it through a few throws between the mesh and the peritoneum to anchor the mesh to the abdominal wall.  In a similar fashion, I  used the other V-Loc suture that had been placed in the abdomen to anchor the 6 o'clock position of the mesh to the anterior abdominal wall to again also get the mesh flush against the abdominal wall.  We then inverted the camera and I then ran the V-Loc  in a circumferential manner around the edge of the mesh, alternating between a dolphin stitch and a baseball stitch where needed for ergonomic purposes.  The V-Loc was then run back several times to lock it in position.  That needle was cut and placed  back into the peritoneum off to the side to not lose track of it.  The other V-Loc suture that had been placed at the 6 o'clock position was then grasped and ran to secure the edge of the mesh to the abdominal wall peritoneum and again alternating  between the dolphin and baseball stitch where needed.  It was then ran back several times to lock it into position.  This still left a small area in 1 quadrant, so a third V-Loc suture was placed in the abdomen by my assistant.  This V-Loc was used to  secure the remaining quarter of the mesh edge to the peritoneum.  I then used this remaining V-Loc to run along the middle portion of the mesh to anchor the mesh to the midline centrally all the way down the length of the mesh.  At this point, my  assistant removed the 3 V-Loc sutures from the abdomen.  We then inverted the image to the correct orientation.  The mesh was flush against the abdominal wall.  There were no exposed edges.  There was no redundancy in the mesh.  At this time, the robotic  instruments were removed and the robot was undocked.  I scrubbed back in.  We performed a bilateral Exparel/Marcaine TAP block using the endoscope as guidance.  The 12 mm trocar was removed and I closed that  fascial defect with a 0 Vicryl using a PMI  suture passer with endoscopic guidance.  Local Exparel mixture was infiltrated in this location.  I removed the remaining 8 mm trocars.  We probed the areas, they did not appear enlarged or stretched as a result of doing that procedure.  Skin incisions  were closed with a 4-0 Monocryl in a subcuticular fashion, followed by the application of Steri-Strips and sterile bandages.  All needle, instrument, sponge counts were correct x2.  There were no immediate complications.  The patient was extubated and  taken to the recovery room in stable condition.  Dr. Wendie Simmer observed my case today.  VN/NUANCE  D:09/21/2019 T:09/21/2019 JOB:[id number]

## 2019-09-21 NOTE — Anesthesia Postprocedure Evaluation (Signed)
Anesthesia Post Note  Patient: Kayla Gross  Procedure(s) Performed: XI ROBOTIC VENTRAL HERNIA x2  WITH MESH (N/A )     Patient location during evaluation: PACU Anesthesia Type: General Level of consciousness: sedated Pain management: pain level controlled Vital Signs Assessment: post-procedure vital signs reviewed and stable Respiratory status: spontaneous breathing and respiratory function stable Cardiovascular status: stable Postop Assessment: no apparent nausea or vomiting Anesthetic complications: no   No complications documented.  Last Vitals:  Vitals:   09/21/19 1700 09/21/19 1729  BP: 124/79 117/79  Pulse: (!) 56 (!) 57  Resp: 13 14  Temp: 36.6 C 36.7 C  SpO2: 97% 97%    Last Pain:  Vitals:   09/21/19 1600  TempSrc:   PainSc: 6                  Koray Soter DANIEL

## 2019-09-21 NOTE — Anesthesia Procedure Notes (Signed)
Procedure Name: Intubation Date/Time: 09/21/2019 12:11 PM Performed by: Glory Buff, CRNA Pre-anesthesia Checklist: Patient identified, Emergency Drugs available, Suction available and Patient being monitored Patient Re-evaluated:Patient Re-evaluated prior to induction Oxygen Delivery Method: Circle system utilized Preoxygenation: Pre-oxygenation with 100% oxygen Induction Type: IV induction Ventilation: Mask ventilation without difficulty Laryngoscope Size: Miller and 3 Grade View: Grade I Tube type: Oral Tube size: 7.0 mm Number of attempts: 1 Airway Equipment and Method: Stylet and Oral airway Placement Confirmation: ETT inserted through vocal cords under direct vision,  positive ETCO2 and breath sounds checked- equal and bilateral Secured at: 21 cm Tube secured with: Tape Dental Injury: Teeth and Oropharynx as per pre-operative assessment

## 2019-09-21 NOTE — H&P (Signed)
Kayla Gross is an 39 y.o. female.   Chief Complaint: here for surgery HPI: 39 year old female presents for repair of abdominal wall hernia x2.  She denies any medical changes since I saw her in late April.  She denies any trips to the emergency room or hospital.  After her CT scan I called her to discuss the results of her CT scan findings on the phone.  She was found to have 2 small ventral hernias.  She decided to proceed with repair.  The patient is a 39 year old female who presents with an abdominal wall hernia. She is referred dr Stephanie Acre for evaluation of a ventral hernia. Patient states it is been there for over a year. With covid everything got pushed back and is now getting it investigated. She states that. She had a severe case of stomach sickness and had multiple similar episodes of nausea vomiting and then noticed a bulge in her upper abdomen slightly right of the midline. It bothers her. It feels like a pressure sensation. She also thinks it is getting bigger. She will have discomfort in the area after eating or drinking lodgment of water. She denies any right upper quadrant and right shoulder pain. She will have a bloating sensation with a but not really bloated per se. She denies any prior abdominal surgery. She has a daily bowel movement. She used to use E cigarettes but no longer. She had a cardiac ablation in December 2024 PVCs. She is on medication for her are PVCs and is quite well controlled. She recently had a cardiac monitor placed in March. She states that there are no abnormalities found. She denies any chest pain or chest tightness or any nausea or vomiting. She does have some anxiety and panic issues. She does take a muscle relaxant for neck pain.  I reviewed the referring PCPs office note along with 2 of her cardiology notes through care everywhere  Past Medical History:  Diagnosis Date  . Anxiety   . Bipolar 1 disorder (HCC)    HX OF ABUSE  . Chronic  headache   . Chronic low back pain   . Depression   . Dysrhythmia    PVCS  . Pectus excavatum   . Vitamin D deficiency     Past Surgical History:  Procedure Laterality Date  . ABLATION     cardiac for irregular heart rhythm  . TONSILLECTOMY      Family History  Problem Relation Age of Onset  . Diabetes Sister    Social History:  reports that she quit smoking about 6 weeks ago. Her smoking use included cigarettes. She started smoking about 25 years ago. She has a 4.75 pack-year smoking history. She has never used smokeless tobacco. She reports current alcohol use. She reports that she does not use drugs.  Allergies: No Known Allergies  Medications Prior to Admission  Medication Sig Dispense Refill  . celecoxib (CELEBREX) 200 MG capsule Take 200 mg by mouth daily as needed for moderate pain.     . cyclobenzaprine (FLEXERIL) 10 MG tablet Take 10 mg by mouth 3 (three) times daily as needed for muscle spasms.    . metoprolol succinate (TOPROL-XL) 100 MG 24 hr tablet Take 100 mg by mouth daily. Take with or immediately following a meal.    . ondansetron (ZOFRAN) 4 MG tablet Take 4 mg by mouth daily as needed for nausea or vomiting.    . promethazine (PHENERGAN) 25 MG tablet Take 12.5 mg by  mouth every 8 (eight) hours as needed for nausea or vomiting.    . SUMAtriptan (IMITREX) 50 MG tablet Take 50 mg by mouth every 2 (two) hours as needed for migraine.     Marland Kitchen acetaminophen (TYLENOL) 500 MG tablet Take 1,000 mg by mouth every 6 (six) hours as needed for moderate pain.     Marland Kitchen ibuprofen (ADVIL) 600 MG tablet Take 1 tablet (600 mg total) by mouth every 6 (six) hours as needed. (Patient not taking: Reported on 09/02/2019) 30 tablet 0  . methocarbamol (ROBAXIN-750) 750 MG tablet Take 1 tablet (750 mg total) by mouth 4 (four) times daily. (Patient not taking: Reported on 09/02/2019) 30 tablet 0  . metoprolol succinate (TOPROL-XL) 50 MG 24 hr tablet Take 1 tablet (50 mg total) by mouth 2 (two) times  daily. (Patient not taking: Reported on 09/02/2019) 180 tablet 3    No results found for this or any previous visit (from the past 48 hour(s)). No results found.  Review of Systems  All other systems reviewed and are negative.   Blood pressure 129/76, pulse 61, temperature 98.3 F (36.8 C), temperature source Oral, resp. rate 17, height 5\' 6"  (1.676 m), weight 75.8 kg, last menstrual period 08/29/2019, SpO2 99 %. Physical Exam  Vitals reviewed. Constitutional: She is oriented to person, place, and time. She appears well-developed. No distress.  HENT:  Head: Normocephalic and atraumatic.  Right Ear: External ear normal.  Left Ear: External ear normal.  Eyes: Conjunctivae are normal. No scleral icterus.  Neck: No tracheal deviation present. No thyromegaly present.  Cardiovascular: Normal rate and normal heart sounds.  Respiratory: Effort normal and breath sounds normal. No stridor. No respiratory distress. She has no wheezes.  GI: Soft. She exhibits no distension. There is no abdominal tenderness. There is no rebound. A hernia is present. Hernia confirmed positive in the ventral area.  Bulge in epigastric area Small bulge around umbilicus  Musculoskeletal:        General: No tenderness.     Cervical back: Normal range of motion and neck supple.  Lymphadenopathy:    She has no cervical adenopathy.  Neurological: She is alert and oriented to person, place, and time. She exhibits normal muscle tone.  Skin: Skin is warm and dry. No rash noted. She is not diaphoretic. No erythema. No pallor.  Psychiatric: Her behavior is normal. Judgment and thought content normal.     Assessment/Plan Ventral hernia x2 Anxiety History of PVCs  2 OR for robotic repair of ventral hernia x2 with mesh ERS protocol IV antibiotic on-call  We discussed robotic repair of her ventral hernia x2.  I discussed that I would use small incisions to the side, then in the insufflator abdomen, reduce the herniated  fat and then closed the muscle with suture and then suture  the mesh to the abdominal wall.  We discussed how this different than the laparoscopic assisted repair that we had initially discussed a few weeks ago.  I discussed that my assistant would be at the bedside while I was at the console.  We discussed that the risks of surgery were very similar to laparoscopic assisted approach.  I do think the robotic approach would have less postoperative pain since the mesh would not be fixed trans fascially or with tacks.  We would also avoid a small midline incision with this approach as well.  I also informed her there would be a surgeon observing me.  All of her questions were asked and  answered.   Leighton Ruff. Redmond Pulling, MD, FACS General, Bariatric, & Minimally Invasive Surgery [facility], [place]   Greer Pickerel, MD 09/21/2019, 11:44 AM

## 2019-09-21 NOTE — Progress Notes (Signed)
Kayla Gross Documented: 07/30/2019 11:32 AM Location: [facility] Patient #: [id number] DOB: Sep 03, 1980 Married / Language: English / Race: White Female   History of Present Illness Kayla Gross; 07/30/2019 12:04 PM) The patient is a 39 year old female who presents with an abdominal wall hernia. She is referred dr Kayla Gross for evaluation of a ventral hernia. Patient states it is been there for over a year. With covid everything got pushed back and is now getting it investigated. She states that. She had a severe case of stomach sickness and had multiple similar episodes of nausea vomiting and then noticed a bulge in her upper abdomen slightly right of the midline. It bothers her. It feels like a pressure sensation. She also thinks it is getting bigger. She will have discomfort in the area after eating or drinking lodgment of water. She denies any right upper quadrant and right shoulder pain. She will have a bloating sensation with a but not really bloated per se. She denies any prior abdominal surgery. She has a daily bowel movement. She used to use E cigarettes but no longer. She had a cardiac ablation in December 2024 PVCs. She is on medication for her are PVCs and is quite well controlled. She recently had a cardiac monitor placed in March. She states that there are no abnormalities found. She denies any chest pain or chest tightness or any nausea or vomiting. She does have some anxiety and panic issues. She does take a muscle relaxant for neck pain.  I reviewed the referring PCPs office note along with 2 of her cardiology notes through care everywhere   Problem List/Past Medical Kayla Gross; 07/30/2019 12:04 PM) VENTRAL HERNIA WITHOUT OBSTRUCTION OR GANGRENE (K43.9)  HISTORY OF ATRIOVENTRICULAR NODAL ABLATION (Z98.890)  OVERWEIGHT (E66.3)   Past Surgical History (Kayla Gross; 07/30/2019 11:33 AM) Tonsillectomy   Diagnostic Studies  History (Kayla Gross; 07/30/2019 11:33 AM) Colonoscopy  never Mammogram  never Pap Smear  1-5 years ago  Allergies (Kayla Gross; 07/30/2019 11:33 AM) No Known Drug Allergies  [07/30/2019]: Allergies Reconciled   Medication History (Kayla Gross; 07/30/2019 11:34 AM) Aspirin Low Dose (81MG  Tablet DR, Oral) Active. Cyclobenzaprine HCl (10MG  Tablet, Oral) Active. Metoprolol Succinate ER (100MG  Tablet ER 24HR, Oral) Active. Ondansetron HCl (4MG  Tablet, Oral) Active. SUMAtriptan Succinate (50MG  Tablet, Oral) Active. CeleBREX (200MG  Capsule, Oral) Active. Medications Reconciled  Social History (Kayla Gross; 07/30/2019 11:33 AM) Alcohol use  Moderate alcohol use. Caffeine use  Coffee. Illicit drug use  Remotely quit drug use. Tobacco use  Former smoker.  Family History (Kayla Gross; 07/30/2019 11:33 AM) Depression  Daughter, Father, Mother, Son. Diabetes Mellitus  Sister. Hypertension  Father. Migraine Headache  Daughter, Mother, Sister.  Pregnancy / Birth History (Kayla Gross; 07/30/2019 11:33 AM) Age at menarche  1 years. Contraceptive History  Intrauterine device. Gravida  3 Maternal age  34-20 Para  4 Regular periods   Other Problems Kayla Gross; 07/30/2019 12:04 PM) Anxiety Disorder  Back Pain  Depression  Migraine Headache     Review of Systems (Kayla Gross; 07/30/2019 11:33 AM) General Not Present- Appetite Loss, Chills, Fatigue, Fever, Night Sweats, Weight Gain and Weight Loss. Skin Not Present- Change in Wart/Mole, Dryness, Hives, Jaundice, New Lesions, Non-Healing Wounds, Rash and Ulcer. HEENT Not Present- Earache, Hearing Loss, Hoarseness, Nose Bleed, Oral Ulcers, Ringing in the Ears, Seasonal Allergies, Sinus Pain, Sore  Throat, Visual Disturbances, Wears glasses/contact lenses and Yellow Eyes. Respiratory Not Present- Bloody sputum, Chronic Cough, Difficulty  Breathing, Snoring and Wheezing. Breast Not Present- Breast Mass, Breast Pain, Nipple Discharge and Skin Changes. Cardiovascular Not Present- Chest Pain, Difficulty Breathing Lying Down, Leg Cramps, Palpitations, Rapid Heart Rate, Shortness of Breath and Swelling of Extremities. Gastrointestinal Not Present- Abdominal Pain, Bloating, Bloody Stool, Change in Bowel Habits, Chronic diarrhea, Constipation, Difficulty Swallowing, Excessive gas, Gets full quickly at meals, Hemorrhoids, Indigestion, Nausea, Rectal Pain and Vomiting. Female Genitourinary Not Present- Frequency, Nocturia, Painful Urination, Pelvic Pain and Urgency. Musculoskeletal Not Present- Back Pain, Joint Pain, Joint Stiffness, Muscle Pain, Muscle Weakness and Swelling of Extremities. Neurological Not Present- Decreased Memory, Fainting, Headaches, Numbness, Seizures, Tingling, Tremor, Trouble walking and Weakness. Psychiatric Present- Anxiety. Not Present- Bipolar, Change in Sleep Pattern, Depression, Fearful and Frequent crying. Endocrine Not Present- Cold Intolerance, Excessive Hunger, Hair Changes, Heat Intolerance, Hot flashes and New Diabetes. Hematology Not Present- Blood Thinners, Easy Bruising, Excessive bleeding, Gland problems, HIV and Persistent Infections.  Vitals (Kayla Gross; 07/30/2019 11:34 AM) 07/30/2019 11:34 AM Weight: 187.8 lb Height: 66in Body Surface Area: 1.95 m Body Mass Index: 30.31 kg/m  Temp.: 56F  Pulse: 95 (Regular)  BP: 122/74(Sitting, Left Arm, Standard)       Physical Exam Kayla Gross M. Sante Biedermann Gross; 07/30/2019 12:02 PM) General Mental Status-Alert. General Appearance-Consistent with stated age. Hydration-Well hydrated. Voice-Normal.  Head and Neck Head-normocephalic, atraumatic with no lesions or palpable masses. Trachea-midline. Thyroid Gland Characteristics - normal size and consistency.  Eye Eyeball - Bilateral-Extraocular movements  intact. Sclera/Conjunctiva - Bilateral-No scleral icterus.  ENMT Ears Pinna - Bilateral - no bony growth in lateral aspect of ear canal, no edema. Nose and Sinuses External Inspection of the Nose - symmetric, no deformities observed. Mouth and Throat -Note: Normal lips.   Chest and Lung Exam Chest and lung exam reveals -quiet, even and easy respiratory effort with no use of accessory muscles and on auscultation, normal breath sounds, no adventitious sounds and normal vocal resonance. Inspection Chest Wall - Normal. Back - normal.  Breast - Did not examine.  Cardiovascular Cardiovascular examination reveals -normal heart sounds, regular rate and rhythm with no murmurs and normal pedal pulses bilaterally.  Abdomen Inspection  Inspection of the abdomen reveals: Note: Patient examined supine and standing. Chaperone present - Kayla. This to be a palpable bulge just the right of the midline in the upper abdomen. There is a fullness there. Hard to determine fascial dimensions but probably about maybe 2 x 2 centimeters. There is a well-circumscribed soft tissue lump to the right of the midline more noticeable upon standing. No umbilical fascial defect. Skin - Scar - no surgical scars. Palpation/Percussion Palpation and Percussion of the abdomen reveal - Soft, Non Tender, No Rebound tenderness, No Rigidity (guarding) and No hepatosplenomegaly. Auscultation Auscultation of the abdomen reveals - Bowel sounds normal.  Peripheral Vascular Upper Extremity Palpation - Pulses bilaterally normal.  Neurologic Neurologic evaluation reveals -alert and oriented x 3 with no impairment of recent or remote memory. Mental Status-Normal.  Neuropsychiatric The patient's mood and affect are described as -normal. Judgment and Insight-insight is appropriate concerning matters relevant to self.  Musculoskeletal Normal Exam - Left-Upper Extremity Strength Normal and Lower Extremity  Strength Normal. Normal Exam - Right-Upper Extremity Strength Normal and Lower Extremity Strength Normal.  Lymphatic Head & Neck  General Head & Neck Lymphatics: Bilateral - Description - Normal. Axillary - Did not examine. Femoral & Inguinal - Did  not examine.    Assessment & Plan Kayla Gross M. Ermon Sagan Gross; 07/30/2019 12:00 PM) VENTRAL HERNIA WITHOUT OBSTRUCTION OR GANGRENE (K43.9) Impression: Her physical exam is c/w a upper ventral hernia slightly to right of midline - epigastric hernia. not c/w diastasis. We discussed the etiology of ventral hernias. We discussed the signs and symptoms of incarceration and strangulation. The patient was given educational material. I also drew diagrams.  We discussed nonoperative and operative management. With respect to operative management, we discussed both open repair and laparoscopic repair. We discussed the pros and cons of each approach. I discussed the typical aftercare with each procedure and how each procedure differs.  The patient has elected to proceed with surgery once CT confirms hernia and size.  I'm recommending laparoscopic assisted repair with mesh if fascia defect is >1.5 cm  We discussed the risk and benefits of surgery including but not limited to bleeding, infection, injury to surrounding structures, hernia recurrence, mesh complications, hematoma/seroma formation, need to convert to an open procedure, blood clot formation, urinary retention, post operative ileus, general anesthesia risk, long-term abdominal pain. We discussed that this procedure can be quite uncomfortable and difficult to recover from based on how the mesh is secured to the abdominal wall. We discussed the importance of avoiding heavy lifting and straining for a period of 6 weeks.  We will get a CT scan of her abdomen to confirm the physical exam findings as well as to finalize operative planning. Her exam is a little bit limited with respect for me to be able to determine  the actual size of the fascial defect. In the interim we will also get cardiac clearance Current Plans Follow Up - Call [facility] office after tests / studies doneto discuss further plans Pt Education - Pamphlet Given - Hernia Surgery: discussed with patient and provided information. HISTORY OF ATRIOVENTRICULAR NODAL ABLATION (F09.323) Impression: We'll need cardiac clearance OVERWEIGHT (E66.3) Impression: Discussed the importance of weight with respect to hernia recurrence  Leighton Ruff. Redmond Pulling, Gross, FACS General, Bariatric, & Minimally Invasive Surgery [facility], [place]

## 2019-09-21 NOTE — Transfer of Care (Signed)
Immediate Anesthesia Transfer of Care Note  Patient: Elizebath M Schier  Procedure(s) Performed: XI ROBOTIC VENTRAL HERNIA x2  WITH MESH (N/A )  Patient Location: PACU  Anesthesia Type:General  Level of Consciousness: drowsy, patient cooperative and responds to stimulation  Airway & Oxygen Therapy: Patient Spontanous Breathing and Patient connected to face mask oxygen  Post-op Assessment: Report given to RN and Post -op Vital signs reviewed and stable  Post vital signs: Reviewed and stable  Last Vitals:  Vitals Value Taken Time  BP 123/85 09/21/19 1537  Temp    Pulse 66 09/21/19 1539  Resp 18 09/21/19 1539  SpO2 100 % 09/21/19 1539  Vitals shown include unvalidated device data.  Last Pain:  Vitals:   09/21/19 1046  TempSrc:   PainSc: 0-No pain      Patients Stated Pain Goal: 4 (38/38/18 4037)  Complications: No complications documented.

## 2019-09-21 NOTE — Discharge Instructions (Signed)
[facility]: POST OP INSTRUCTIONS Always review your discharge instruction sheet given to you by the facility where your surgery was performed. IF YOU HAVE DISABILITY OR FAMILY LEAVE FORMS, YOU MUST BRING THEM TO THE OFFICE FOR PROCESSING.   DO NOT GIVE THEM TO YOUR DOCTOR.  PAIN CONTROL  1. First take acetaminophen (Tylenol) AND/or ibuprofen (Advil) to control your pain after surgery.  Follow directions on package.  Taking acetaminophen (Tylenol) and/or ibuprofen (Advil) regularly after surgery will help to control your pain and lower the amount of prescription pain medication you may need.  You should not take more than 3,000 mg (3 grams) of acetaminophen (Tylenol) in 24 hours.  You should not take ibuprofen (Advil), aleve, motrin, naprosyn or other NSAIDS if you have a history of stomach ulcers or chronic kidney disease.  2. A prescription for pain medication may be given to you upon discharge.  Take your pain medication as prescribed, if you still have uncontrolled pain after taking acetaminophen (Tylenol) or ibuprofen (Advil). 3. Use ice packs to help control pain. 4. If you need a refill on your pain medication, please contact your pharmacy.  They will contact our office to request authorization. Prescriptions will not be filled after 5pm or on week-ends.  HOME MEDICATIONS 5. Take your usually prescribed medications unless otherwise directed.  DIET 6. You should follow a light diet the first few days after arrival home.  Be sure to include lots of fluids daily. Avoid fatty, fried foods.   CONSTIPATION 7. It is common to experience some constipation after surgery and if you are taking pain medication.  Increasing fluid intake and taking a stool softener (such as Colace) will usually help or prevent this problem from occurring.  A mild laxative (Milk of Magnesia or Miralax) should be taken according to package instructions if there are no bowel  movements after 48 hours.  WOUND/INCISION CARE 8. Most patients will experience some swelling and bruising in the area of the incisions.  Ice packs will help.  Swelling and bruising can take several days to resolve.  9. Unless discharge instructions indicate otherwise, follow guidelines below  a. STERI-STRIPS - you may remove your outer bandages 48 hours after surgery, and you may shower at that time.  You have steri-strips (small skin tapes) in place directly over the incision.  These strips should be left on the skin for 7-10 days.   b. DERMABOND/SKIN GLUE - you may shower in 24 hours.  The glue will flake off over the next 2-3 weeks. 10. Any sutures or staples will be removed at the office during your follow-up visit.  ACTIVITIES 11. You may resume regular (light) daily activities beginning the next day--such as daily self-care, walking, climbing stairs--gradually increasing activities as tolerated.  You may have sexual intercourse when it is comfortable.  Refrain from any heavy lifting or straining until approved by your doctor. a. You may drive when you are no longer taking prescription pain medication, you can comfortably wear a seatbelt, and you can safely maneuver your car and apply brakes.  FOLLOW-UP 12. You should see your doctor in the office for a follow-up appointment approximately 2-3 weeks after your surgery.  You should have been given your post-op/follow-up appointment when your surgery was scheduled.  If you did not receive a post-op/follow-up appointment, make sure that you call for this appointment within a day or two after you arrive home to insure a convenient appointment time.  OTHER  INSTRUCTIONS 13. DO NOT LIFT/PUSH/PULL ANYTHING GREATER THAN 15 LBS FOR 4 WEEKS  WHEN TO CALL YOUR DOCTOR: 1. Fever over 101.0 2. Inability to urinate 3. Continued bleeding from incision. 4. Increased pain, redness, or drainage from the incision. 5. Increasing abdominal pain  The clinic  staff is available to answer your questions during regular business hours.  Please don't hesitate to call and ask to speak to one of the nurses for clinical concerns.  If you have a medical emergency, go to the nearest emergency room or call 911.  A surgeon from [facility] is always on call at the hospital. [address] [place], [place], [place]  [address] ? [address], [place], [place]   [address] [phone] ? [phone] ? FAX [phone] Web site: [email or web address]  ........Marland Kitchen   Managing Your Pain After Surgery Without Opioids    Thank you for participating in our program to help patients manage their pain after surgery without opioids. This is part of our effort to provide you with the best care possible, without exposing you or your family to the risk that opioids pose.  What pain can I expect after surgery? You can expect to have some pain after surgery. This is normal. The pain is typically worse the day after surgery, and quickly begins to get better. Many studies have found that many patients are able to manage their pain after surgery with Over-the-Counter (OTC) medications such as Tylenol and Motrin. If you have a condition that does not allow you to take Tylenol or Motrin, notify your surgical team.  How will I manage my pain? The best strategy for controlling your pain after surgery is around the clock pain control with Tylenol (acetaminophen) and Motrin (ibuprofen or Advil). Alternating these medications with each other allows you to maximize your pain control. In addition to Tylenol and Motrin, you can use heating pads or ice packs on your incisions to help reduce your pain.  How will I alternate your regular strength over-the-counter pain medication? You will take a dose of pain medication every three hours. ; Start by taking 650 mg of Tylenol (2 pills of 325 mg) ; 3 hours later take 600 mg of Motrin (3 pills of 200 mg) ; 3 hours  after taking the Motrin take 650 mg of Tylenol ; 3 hours after that take 600 mg of Motrin.   - 1 -  See example - if your first dose of Tylenol is at 12:00 PM   12:00 PM Tylenol 650 mg (2 pills of 325 mg)  3:00 PM Motrin 600 mg (3 pills of 200 mg)  6:00 PM Tylenol 650 mg (2 pills of 325 mg)  9:00 PM Motrin 600 mg (3 pills of 200 mg)  Continue alternating every 3 hours   We recommend that you follow this schedule around-the-clock for at least 3 days after surgery, or until you feel that it is no longer needed. Use the table on the last page of this handout to keep track of the medications you are taking. Important: Do not take more than 3000mg  of Tylenol or 1800mg  of Motrin in a 24-hour period. Do not take ibuprofen/Motrin if you have a history of bleeding stomach ulcers, severe kidney disease, &/or actively taking a blood thinner  What if I still have pain? If you have pain that is not controlled with the over-the-counter pain medications (Tylenol and Motrin or Advil) you might have what we call "breakthrough" pain. You will  receive a prescription for a small amount of an opioid pain medication such as Oxycodone, Tramadol, or Tylenol with Codeine. Use these opioid pills in the first 24 hours after surgery if you have breakthrough pain. Do not take more than 1 pill every 4-6 hours.  If you still have uncontrolled pain after using all opioid pills, don't hesitate to call our staff using the number provided. We will help make sure you are managing your pain in the best way possible, and if necessary, we can provide a prescription for additional pain medication.   Day 1    Time  Name of Medication Number of pills taken  Amount of Acetaminophen  Pain Level   Comments  AM PM       AM PM       AM PM       AM PM       AM PM       AM PM       AM PM       AM PM       Total Daily amount of Acetaminophen Do not take more than  3,000 mg per day      Day 2    Time  Name of  Medication Number of pills taken  Amount of Acetaminophen  Pain Level   Comments  AM PM       AM PM       AM PM       AM PM       AM PM       AM PM       AM PM       AM PM       Total Daily amount of Acetaminophen Do not take more than  3,000 mg per day      Day 3    Time  Name of Medication Number of pills taken  Amount of Acetaminophen  Pain Level   Comments  AM PM       AM PM       AM PM       AM PM          AM PM       AM PM       AM PM       AM PM       Total Daily amount of Acetaminophen Do not take more than  3,000 mg per day      Day 4    Time  Name of Medication Number of pills taken  Amount of Acetaminophen  Pain Level   Comments  AM PM       AM PM       AM PM       AM PM       AM PM       AM PM       AM PM       AM PM       Total Daily amount of Acetaminophen Do not take more than  3,000 mg per day      Day 5    Time  Name of Medication Number of pills taken  Amount of Acetaminophen  Pain Level   Comments  AM PM       AM PM       AM PM       AM PM       AM PM  AM PM       AM PM       AM PM       Total Daily amount of Acetaminophen Do not take more than  3,000 mg per day       Day 6    Time  Name of Medication Number of pills taken  Amount of Acetaminophen  Pain Level  Comments  AM PM       AM PM       AM PM       AM PM       AM PM       AM PM       AM PM       AM PM       Total Daily amount of Acetaminophen Do not take more than  3,000 mg per day      Day 7    Time  Name of Medication Number of pills taken  Amount of Acetaminophen  Pain Level   Comments  AM PM       AM PM       AM PM       AM PM       AM PM       AM PM       AM PM       AM PM       Total Daily amount of Acetaminophen Do not take more than  3,000 mg per day        For additional information about how and where to safely dispose of unused opioid medications - [employer].com.br  Disclaimer: This  document contains information and/or instructional materials adapted from [place] for the typical patient with your condition. It does not replace medical advice from your health care provider because your experience may differ from that of the typical patient. Talk to your health care provider if you have any questions about this document, your condition or your treatment plan. Adapted from [place]

## 2019-09-21 NOTE — Brief Op Note (Addendum)
09/21/2019  3:45 PM  PATIENT:  Kayla Gross  39 y.o. female  PRE-OPERATIVE DIAGNOSIS:  VENTRAL HERNIA x2  POST-OPERATIVE DIAGNOSIS:  VENTRAL HERNIA x2  PROCEDURE:  Procedure(s): XI ROBOTIC VENTRAL HERNIA x2  WITH MESH (N/A)  SURGEON:  Surgeon(s) and Role:    * Greer Pickerel, MD - Primary    * White, Sharon Mt, MD - Assisting  PHYSICIAN ASSISTANT:   ASSISTANTS: Annye English MD   ANESTHESIA:   general  EBL:  20 mL   BLOOD ADMINISTERED:none  DRAINS: none   Type of repair - primary sutre with mesh underlay (IPOM)   Name of mesh - Bard ventralight st  Size of mesh -round 15.2 cm  Mesh overlap - >5 cm  Placement of mesh -  beneath fascia and into peritoneal cavity,   LOCAL MEDICATIONS USED:  MARCAINE    and OTHER exparel  SPECIMEN:  Source of Specimen:  herniated fat  DISPOSITION OF SPECIMEN:  discarded  COUNTS:  YES  TOURNIQUET:  * No tourniquets in log *  DICTATION: Viviann Spare Dictation [phone]  PLAN OF CARE: Discharge to home after PACU  PATIENT DISPOSITION:  PACU - hemodynamically stable.   Delay start of Pharmacological VTE agent (>24hrs) due to surgical blood loss or risk of bleeding: not applicable  Leighton Ruff. Redmond Pulling, MD, FACS General, Bariatric, & Minimally Invasive Surgery [facility], [place]

## 2019-09-22 ENCOUNTER — Encounter: Payer: BC Managed Care – PPO | Admitting: Physical Therapy

## 2020-01-20 DIAGNOSIS — I493 Ventricular premature depolarization: Secondary | ICD-10-CM | POA: Diagnosis not present

## 2020-01-20 DIAGNOSIS — R002 Palpitations: Secondary | ICD-10-CM | POA: Diagnosis not present

## 2020-02-15 DIAGNOSIS — H20012 Primary iridocyclitis, left eye: Secondary | ICD-10-CM | POA: Diagnosis not present

## 2020-02-22 DIAGNOSIS — H20012 Primary iridocyclitis, left eye: Secondary | ICD-10-CM | POA: Diagnosis not present

## 2020-03-18 DIAGNOSIS — Z09 Encounter for follow-up examination after completed treatment for conditions other than malignant neoplasm: Secondary | ICD-10-CM | POA: Diagnosis not present

## 2020-03-18 DIAGNOSIS — Z8719 Personal history of other diseases of the digestive system: Secondary | ICD-10-CM | POA: Diagnosis not present

## 2020-03-30 DIAGNOSIS — Z8719 Personal history of other diseases of the digestive system: Secondary | ICD-10-CM | POA: Diagnosis not present

## 2020-03-30 DIAGNOSIS — Z09 Encounter for follow-up examination after completed treatment for conditions other than malignant neoplasm: Secondary | ICD-10-CM | POA: Diagnosis not present

## 2020-03-30 DIAGNOSIS — R1011 Right upper quadrant pain: Secondary | ICD-10-CM | POA: Diagnosis not present

## 2020-03-31 ENCOUNTER — Other Ambulatory Visit: Payer: Self-pay | Admitting: General Surgery

## 2020-03-31 DIAGNOSIS — R1011 Right upper quadrant pain: Secondary | ICD-10-CM

## 2020-04-18 ENCOUNTER — Ambulatory Visit
Admission: RE | Admit: 2020-04-18 | Discharge: 2020-04-18 | Disposition: A | Discharge status: Home / Self Care | Payer: BC Managed Care – PPO | Source: Ambulatory Visit | Attending: General Surgery | Admitting: General Surgery

## 2020-04-18 DIAGNOSIS — D259 Leiomyoma of uterus, unspecified: Secondary | ICD-10-CM | POA: Diagnosis not present

## 2020-04-18 DIAGNOSIS — R1011 Right upper quadrant pain: Secondary | ICD-10-CM

## 2020-04-18 DIAGNOSIS — K439 Ventral hernia without obstruction or gangrene: Secondary | ICD-10-CM | POA: Diagnosis not present

## 2020-04-18 DIAGNOSIS — K429 Umbilical hernia without obstruction or gangrene: Secondary | ICD-10-CM | POA: Diagnosis not present

## 2020-04-18 DIAGNOSIS — I878 Other specified disorders of veins: Secondary | ICD-10-CM | POA: Diagnosis not present

## 2020-04-18 MED ORDER — IOPAMIDOL (ISOVUE-300) INJECTION 61%
100.0000 mL | Freq: Once | INTRAVENOUS | Status: AC | PRN
  Administered 2020-04-18: 100 mL via INTRAVENOUS

## 2020-04-28 DIAGNOSIS — Z6827 Body mass index (BMI) 27.0-27.9, adult: Secondary | ICD-10-CM | POA: Diagnosis not present

## 2020-04-28 DIAGNOSIS — G96191 Perineural cyst: Secondary | ICD-10-CM | POA: Diagnosis not present

## 2020-04-28 DIAGNOSIS — R030 Elevated blood-pressure reading, without diagnosis of hypertension: Secondary | ICD-10-CM | POA: Diagnosis not present

## 2020-05-17 IMAGING — MR MR HEAD WO/W CM
12 series · 48 of 48 positions shown · IV contrast (15ml Multihance)
Comparison: None.

CLINICAL DATA: Recurring iritis and migraines. Symptoms for 2
years. LEFT-sided weakness. Question of demyelinating disease.

EXAM:
MRI HEAD WITHOUT AND WITH CONTRAST
TECHNIQUE: Multiplanar, multiecho pulse sequences of the brain and surrounding
structures were obtained without and with intravenous contrast.
CONTRAST:  15mL MULTIHANCE GADOBENATE DIMEGLUMINE 529 MG/ML IV SOLN

[Series 2: T1 · sagittal · 5.0mm · 0.45mm/px · 2 of 24 slices shown]
[im 1/24]
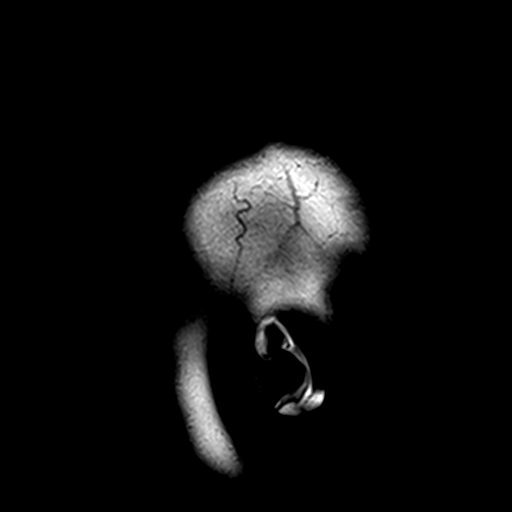
[im 24/24]
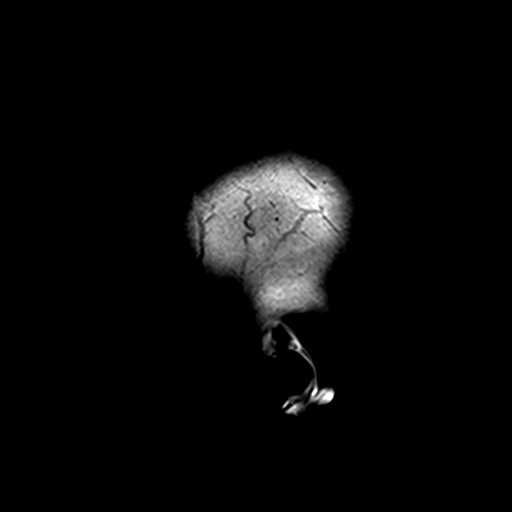

[Series 3: DWI · axial · 3.0mm · 1.80mm/px · z∈[-54,+93]mm · 7 of 100 slices shown (1 of 4)]
[im 1/100]
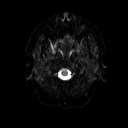
[im 17/100]
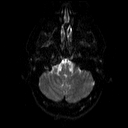
[im 34/100]
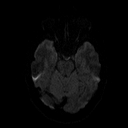
[im 50/100]
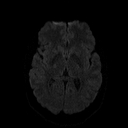
[im 67/100]
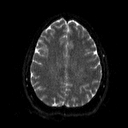
[im 83/100]
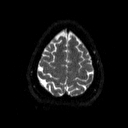
[im 100/100]
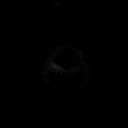

[Series 4: DWI · axial · 3.0mm · 1.80mm/px · z∈[-54,+93]mm · 3 of 49 slices shown (2 of 4)]
[im 1/49]
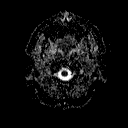
[im 25/49]
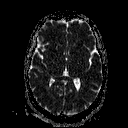
[im 49/49]
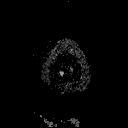

[Series 5: DWI · coronal · 5.0mm · 1.80mm/px · 5 of 76 slices shown (3 of 4)]
[im 1/76]
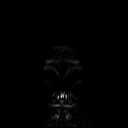
[im 19/76]
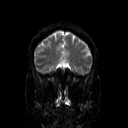
[im 38/76]
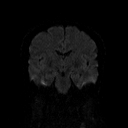
[im 57/76]
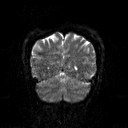
[im 76/76]
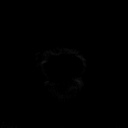

[Series 6: DWI · coronal · 5.0mm · 1.80mm/px · 2 of 38 slices shown (4 of 4)]
[im 1/38]
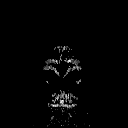
[im 38/38]
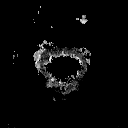

[Series 7: T2 · axial · 5.0mm · 0.51mm/px · z∈[-68,+93]mm · 2 of 25 slices shown (1 of 2)]
[im 1/25]
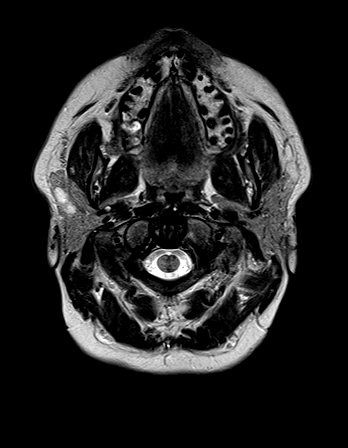
[im 25/25]
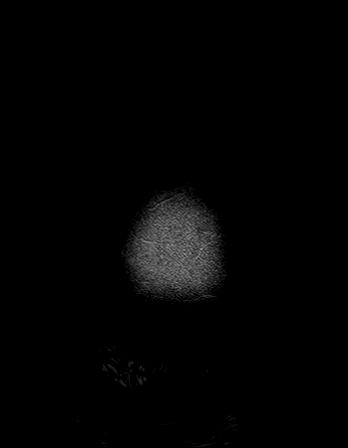

[Series 8: FLAIR · axial · 3.0mm · 0.45mm/px · z∈[-64,+88]mm · 2 of 34 slices shown]
[im 1/34]
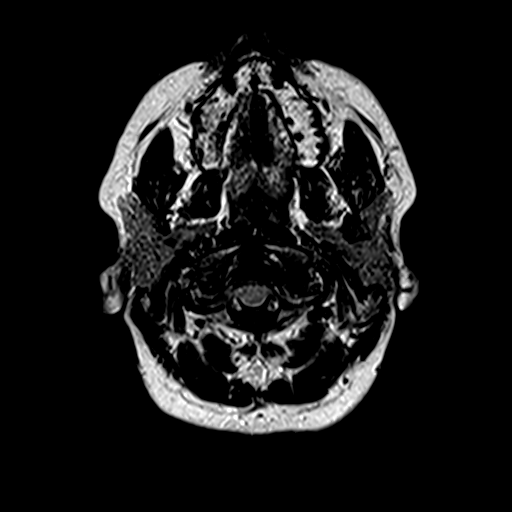
[im 34/34]
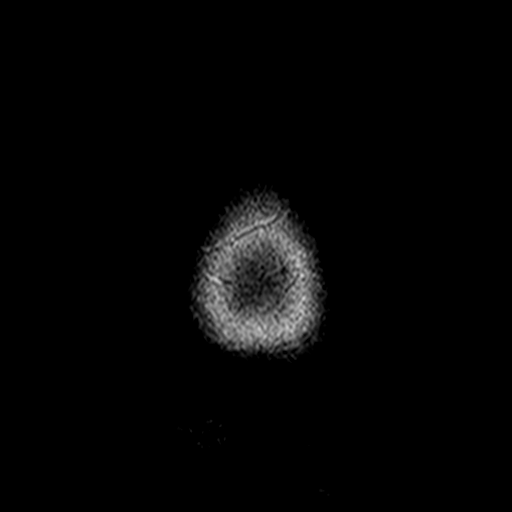

[Series 10: swi_images · axial · 4.0mm · 0.90mm/px · z∈[-64,+91]mm · 3 of 40 slices shown]
[im 1/40]
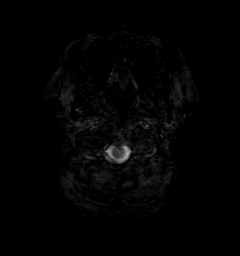
[im 20/40]
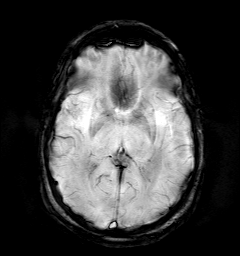
[im 40/40]
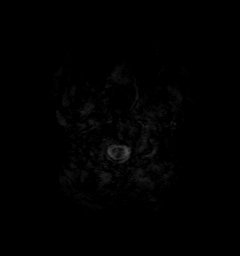

[Series 11: t1_mpr_tra · axial · 1.0mm · 0.75mm/px · z∈[-48,+99]mm · 9 of 144 slices shown (1 of 2)]
[im 1/144]
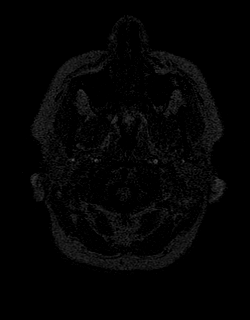
[im 18/144]
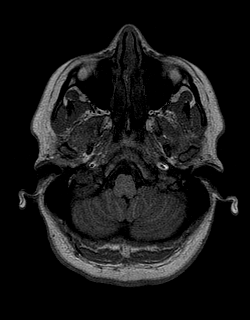
[im 36/144]
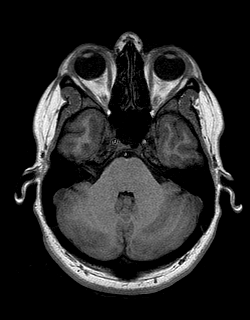
[im 54/144]
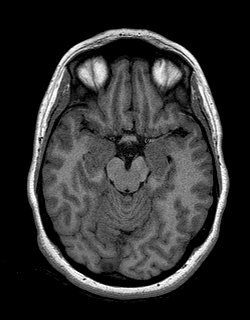
[im 72/144]
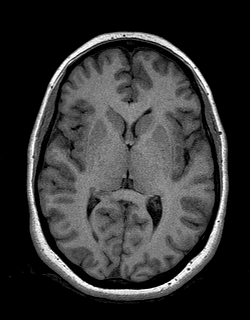
[im 90/144]
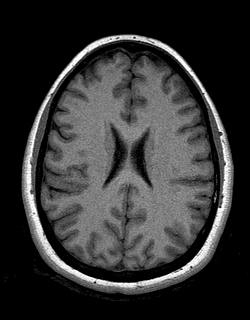
[im 108/144]
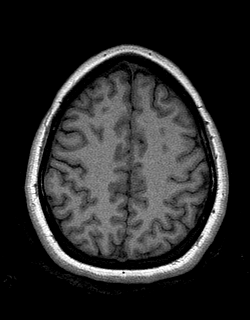
[im 126/144]
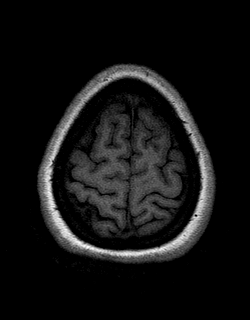
[im 144/144]
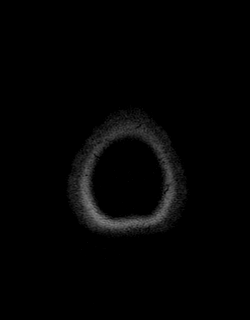

[Series 12: T2 · coronal · 5.0mm · 0.45mm/px · 2 of 31 slices shown (2 of 2)]
[im 1/31]
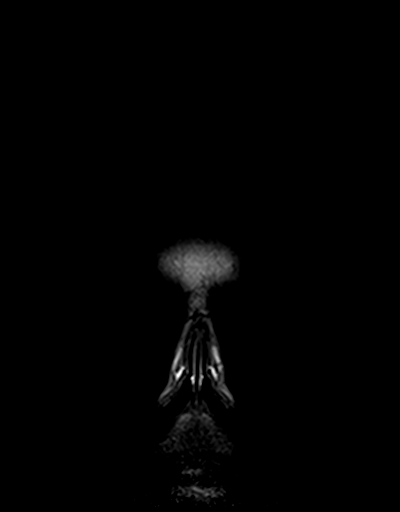
[im 31/31]
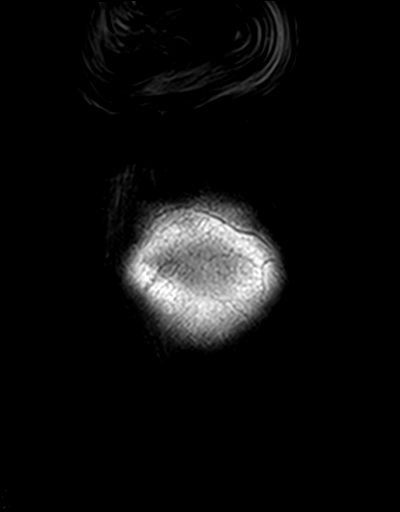

[Series 13: t1_mpr_tra · axial · 1.0mm · 0.75mm/px · z∈[-48,+99]mm · 9 of 144 slices shown (2 of 2)]
[im 1/144]
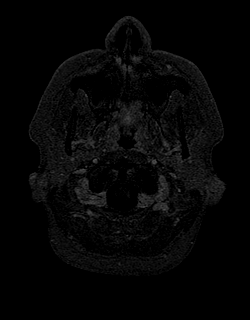
[im 18/144]
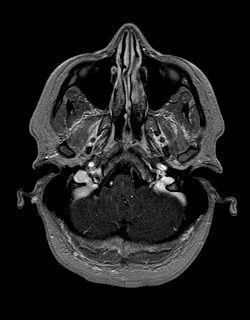
[im 36/144]
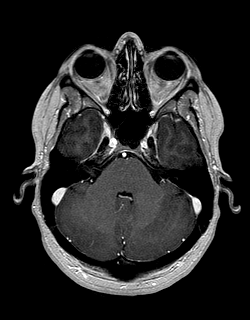
[im 54/144]
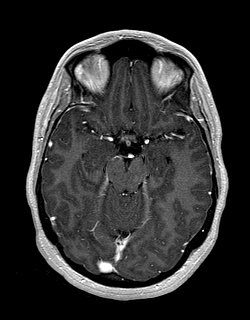
[im 72/144]
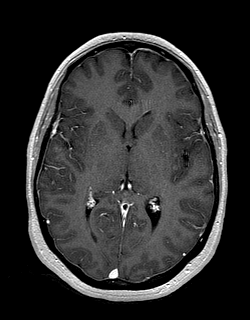
[im 90/144]
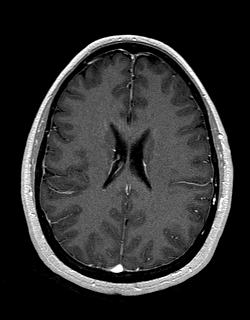
[im 108/144]
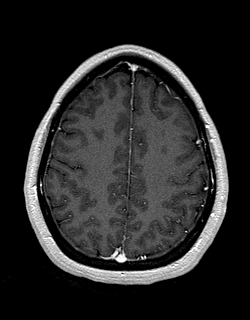
[im 126/144]
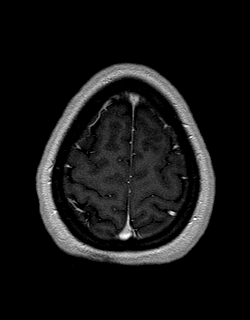
[im 144/144]
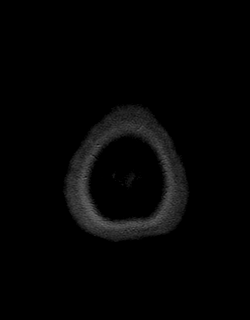

[Series 14: post cor · coronal · 5.0mm · 0.45mm/px · 2 of 31 slices shown]
[im 1/31]
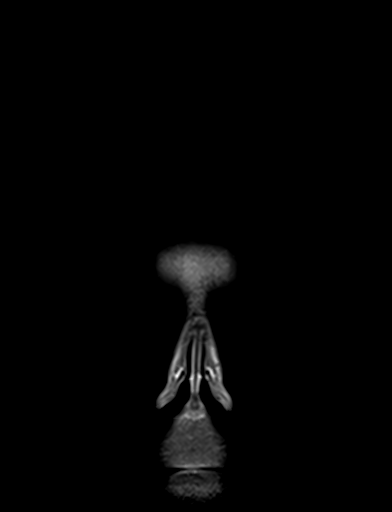
[im 31/31]
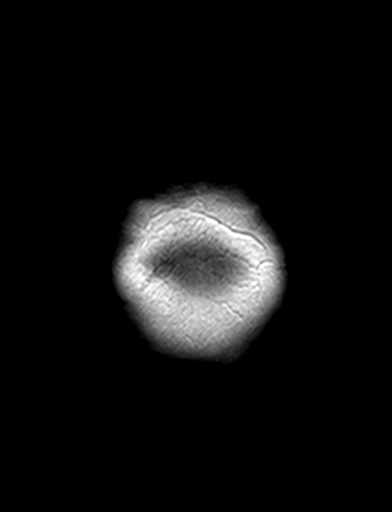

[48 of 48 positions shown; findings below may reference images not displayed]

FINDINGS: Brain: No acute infarction, hemorrhage, hydrocephalus, extra-axial
collection or mass lesion. Normal for age cerebral volume. No
significant white matter disease.

Post infusion, no abnormal enhancement of the brain or meninges.

Vascular: Flow voids are maintained throughout the carotid, basilar,
and vertebral arteries. There are no areas of chronic hemorrhage.

Skull and upper cervical spine: Unremarkable visualized calvarium,
skullbase, and cervical vertebrae. Pituitary, pineal, cerebellar
tonsils unremarkable. No upper cervical cord lesions.

Sinuses/Orbits: No orbital masses or proptosis. Globes appear
symmetric. Sinuses appear well aerated, without evidence for
air-fluid level.

Other: No nasopharyngeal pathology or mastoid fluid. Scalp and other
visualized extracranial soft tissues grossly unremarkable.
IMPRESSION: Negative exam.

## 2020-05-19 DIAGNOSIS — Z01419 Encounter for gynecological examination (general) (routine) without abnormal findings: Secondary | ICD-10-CM | POA: Diagnosis not present

## 2020-05-22 DIAGNOSIS — J029 Acute pharyngitis, unspecified: Secondary | ICD-10-CM | POA: Diagnosis not present

## 2020-05-22 DIAGNOSIS — R52 Pain, unspecified: Secondary | ICD-10-CM | POA: Diagnosis not present

## 2020-05-22 DIAGNOSIS — J069 Acute upper respiratory infection, unspecified: Secondary | ICD-10-CM | POA: Diagnosis not present

## 2020-05-22 DIAGNOSIS — M791 Myalgia, unspecified site: Secondary | ICD-10-CM | POA: Diagnosis not present

## 2020-05-22 DIAGNOSIS — Z20822 Contact with and (suspected) exposure to covid-19: Secondary | ICD-10-CM | POA: Diagnosis not present

## 2020-05-22 DIAGNOSIS — R509 Fever, unspecified: Secondary | ICD-10-CM | POA: Diagnosis not present

## 2020-05-23 ENCOUNTER — Ambulatory Visit: Payer: BC Managed Care – PPO | Admitting: Orthopaedic Surgery

## 2020-05-23 ENCOUNTER — Ambulatory Visit: Payer: Self-pay

## 2020-05-23 ENCOUNTER — Encounter: Payer: Self-pay | Admitting: Orthopaedic Surgery

## 2020-05-23 ENCOUNTER — Other Ambulatory Visit: Payer: Self-pay

## 2020-05-23 VITALS — Ht 66.0 in | Wt 167.0 lb

## 2020-05-23 DIAGNOSIS — M25552 Pain in left hip: Secondary | ICD-10-CM

## 2020-05-23 MED ORDER — GABAPENTIN 300 MG PO CAPS: 300.0000 mg | ORAL_CAPSULE | Freq: Every day | ORAL | 1 refills | Status: DC
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
## 2020-06-10 ENCOUNTER — Ambulatory Visit
Admission: RE | Admit: 2020-06-10 | Discharge: 2020-06-10 | Disposition: A | Discharge status: Home / Self Care | Payer: BC Managed Care – PPO | Source: Ambulatory Visit | Attending: Orthopaedic Surgery | Admitting: Orthopaedic Surgery

## 2020-06-10 ENCOUNTER — Other Ambulatory Visit: Payer: Self-pay

## 2020-06-10 DIAGNOSIS — M25552 Pain in left hip: Secondary | ICD-10-CM

## 2020-06-10 DIAGNOSIS — D259 Leiomyoma of uterus, unspecified: Secondary | ICD-10-CM | POA: Diagnosis not present

## 2020-06-10 DIAGNOSIS — M7138 Other bursal cyst, other site: Secondary | ICD-10-CM | POA: Diagnosis not present

## 2020-06-10 DIAGNOSIS — M85452 Solitary bone cyst, left pelvis: Secondary | ICD-10-CM | POA: Diagnosis not present

## 2020-06-10 MED ORDER — GADOBENATE DIMEGLUMINE 529 MG/ML IV SOLN
15.0000 mL | Freq: Once | INTRAVENOUS | Status: AC | PRN
  Administered 2020-06-10: 15 mL via INTRAVENOUS

## 2020-06-13 ENCOUNTER — Encounter: Payer: Self-pay | Admitting: Orthopaedic Surgery

## 2020-06-13 ENCOUNTER — Ambulatory Visit: Payer: BC Managed Care – PPO | Admitting: Orthopaedic Surgery

## 2020-06-13 DIAGNOSIS — M25552 Pain in left hip: Secondary | ICD-10-CM | POA: Diagnosis not present

## 2020-06-13 NOTE — Progress Notes (Signed)
The patient comes in for follow-up with chronic left hip pain this been going on for many years.  We sent her for an MRI of this hip and pelvis to see if there is any other pathology around her hip that was causing the pain that she is having.  She has had hernia surgery before.  She has a known cyst at S2 on the left.  This was seen on CT scan and MRI in May of last year.  She still deals with chronic hip pain on a daily basis.  The MRI of her pelvis is reviewed with her.  The left and right hip are normal on the MRI.  There is no cartilage wear.  There is no muscle or tendon change around either hip.  The bone and marrow signals appear normal around both hips.  There is a large cyst at S2 on the left.  The radiologist comments about a cyst on the right at S2 as well.  These have not changed from the studies that were done last year.  From my standpoint a model also what else that I would recommend for her.  I can at least say that her left hip is normal.  We could always set her up for a CT-guided aspiration of her left S2 cyst if she would like.  She states she will let us know.  Follow-up with me as needed.

## 2020-06-15 ENCOUNTER — Ambulatory Visit: Payer: BC Managed Care – PPO | Attending: General Surgery | Admitting: Physical Therapy

## 2020-06-15 ENCOUNTER — Encounter: Payer: Self-pay | Admitting: Physical Therapy

## 2020-06-15 ENCOUNTER — Other Ambulatory Visit: Payer: Self-pay

## 2020-06-15 DIAGNOSIS — G8929 Other chronic pain: Secondary | ICD-10-CM | POA: Diagnosis not present

## 2020-06-15 DIAGNOSIS — M545 Low back pain, unspecified: Secondary | ICD-10-CM | POA: Diagnosis not present

## 2020-06-15 DIAGNOSIS — M6281 Muscle weakness (generalized): Secondary | ICD-10-CM | POA: Diagnosis not present

## 2020-06-15 NOTE — Patient Instructions (Signed)
Access Code: [email or web address] URL: [email or web address] Date: 06/15/2020 Prepared by: Jari Favre  Exercises Supine Diaphragmatic Breathing - 3 x daily - 7 x weekly - 1 sets - 10 reps Seated Diaphragmatic Breathing - 3 x daily - 7 x weekly - 1 sets - 10 reps
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
## 2020-06-16 ENCOUNTER — Ambulatory Visit: Payer: BC Managed Care – PPO | Admitting: Physical Therapy

## 2020-06-16 DIAGNOSIS — M545 Low back pain, unspecified: Secondary | ICD-10-CM

## 2020-06-16 DIAGNOSIS — M6281 Muscle weakness (generalized): Secondary | ICD-10-CM | POA: Diagnosis not present

## 2020-06-16 DIAGNOSIS — G8929 Other chronic pain: Secondary | ICD-10-CM
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
## 2020-06-30 ENCOUNTER — Ambulatory Visit: Payer: BC Managed Care – PPO | Admitting: Physical Therapy

## 2020-06-30 ENCOUNTER — Encounter: Payer: Self-pay | Admitting: Physical Therapy

## 2020-06-30 ENCOUNTER — Other Ambulatory Visit: Payer: Self-pay

## 2020-06-30 DIAGNOSIS — G8929 Other chronic pain: Secondary | ICD-10-CM

## 2020-06-30 DIAGNOSIS — M6281 Muscle weakness (generalized): Secondary | ICD-10-CM

## 2020-06-30 DIAGNOSIS — M545 Low back pain, unspecified: Secondary | ICD-10-CM
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
## 2020-06-30 NOTE — Patient Instructions (Signed)
Access Code: [email or web address] URL: [email or web address] Date: 06/30/2020 Prepared by: Jari Favre  Exercises Supine Diaphragmatic Breathing - 3 x daily - 7 x weekly - 1 sets - 10 reps Seated Diaphragmatic Breathing - 3 x daily - 7 x weekly - 1 sets - 10 reps Standing Hip Flexor Stretch - 3 x daily - 7 x weekly - 2 reps - 1 sets - 30 sec hold  Patient Education TENS Unit

## 2020-07-14 ENCOUNTER — Ambulatory Visit: Payer: BC Managed Care – PPO | Attending: General Surgery | Admitting: Physical Therapy

## 2020-07-14 ENCOUNTER — Encounter: Payer: Self-pay | Admitting: Physical Therapy

## 2020-07-14 ENCOUNTER — Other Ambulatory Visit: Payer: Self-pay

## 2020-07-14 DIAGNOSIS — M6281 Muscle weakness (generalized): Secondary | ICD-10-CM | POA: Insufficient documentation

## 2020-07-14 DIAGNOSIS — M545 Low back pain, unspecified: Secondary | ICD-10-CM | POA: Insufficient documentation

## 2020-07-14 DIAGNOSIS — G8929 Other chronic pain: Secondary | ICD-10-CM | POA: Diagnosis not present
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
## 2020-07-25 ENCOUNTER — Encounter: Payer: Self-pay | Admitting: Physical Therapy

## 2020-07-25 ENCOUNTER — Other Ambulatory Visit: Payer: Self-pay

## 2020-07-25 ENCOUNTER — Ambulatory Visit: Payer: BC Managed Care – PPO | Admitting: Physical Therapy

## 2020-07-25 DIAGNOSIS — G8929 Other chronic pain: Secondary | ICD-10-CM

## 2020-07-25 DIAGNOSIS — M6281 Muscle weakness (generalized): Secondary | ICD-10-CM | POA: Diagnosis not present

## 2020-07-25 DIAGNOSIS — M545 Low back pain, unspecified: Secondary | ICD-10-CM
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
## 2020-07-28 ENCOUNTER — Encounter: Payer: BC Managed Care – PPO | Admitting: Physical Therapy

## 2020-07-28 ENCOUNTER — Telehealth: Payer: Self-pay | Admitting: Physical Therapy

## 2020-07-28 NOTE — Telephone Encounter (Signed)
Called and spoke to patient who reports she meant to call to cancel and forgot.  She is out of town today.  Gustavus Bryant, PT 07/28/20 12:10 PM

## 2020-08-01 ENCOUNTER — Ambulatory Visit: Payer: BC Managed Care – PPO | Admitting: Physical Therapy

## 2020-08-01 ENCOUNTER — Other Ambulatory Visit: Payer: Self-pay

## 2020-08-01 ENCOUNTER — Encounter: Payer: Self-pay | Admitting: Physical Therapy

## 2020-08-01 DIAGNOSIS — M6281 Muscle weakness (generalized): Secondary | ICD-10-CM | POA: Diagnosis not present

## 2020-08-01 DIAGNOSIS — M545 Low back pain, unspecified: Secondary | ICD-10-CM | POA: Diagnosis not present

## 2020-08-01 DIAGNOSIS — G8929 Other chronic pain: Secondary | ICD-10-CM | POA: Diagnosis not present
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
## 2020-08-04 ENCOUNTER — Ambulatory Visit: Payer: BC Managed Care – PPO | Admitting: Physical Therapy

## 2020-08-04 ENCOUNTER — Other Ambulatory Visit: Payer: Self-pay

## 2020-08-04 ENCOUNTER — Encounter: Payer: Self-pay | Admitting: Physical Therapy

## 2020-08-04 DIAGNOSIS — G8929 Other chronic pain: Secondary | ICD-10-CM | POA: Diagnosis not present

## 2020-08-04 DIAGNOSIS — M6281 Muscle weakness (generalized): Secondary | ICD-10-CM | POA: Diagnosis not present

## 2020-08-04 DIAGNOSIS — M545 Low back pain, unspecified: Secondary | ICD-10-CM | POA: Diagnosis not present

## 2020-08-04 NOTE — Patient Instructions (Signed)
[email or web address] updated added core strength
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
## 2020-08-08 ENCOUNTER — Ambulatory Visit: Payer: BC Managed Care – PPO | Attending: General Surgery | Admitting: Physical Therapy

## 2020-08-08 ENCOUNTER — Encounter: Payer: Self-pay | Admitting: Physical Therapy

## 2020-08-08 ENCOUNTER — Other Ambulatory Visit: Payer: Self-pay

## 2020-08-08 DIAGNOSIS — M545 Low back pain, unspecified: Secondary | ICD-10-CM | POA: Diagnosis not present

## 2020-08-08 DIAGNOSIS — G8929 Other chronic pain: Secondary | ICD-10-CM | POA: Diagnosis not present

## 2020-08-08 DIAGNOSIS — M6281 Muscle weakness (generalized): Secondary | ICD-10-CM | POA: Diagnosis not present
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
## 2020-08-09 DIAGNOSIS — M5430 Sciatica, unspecified side: Secondary | ICD-10-CM | POA: Diagnosis not present

## 2020-08-09 DIAGNOSIS — D219 Benign neoplasm of connective and other soft tissue, unspecified: Secondary | ICD-10-CM | POA: Diagnosis not present

## 2020-08-09 DIAGNOSIS — N946 Dysmenorrhea, unspecified: Secondary | ICD-10-CM | POA: Diagnosis not present

## 2020-08-11 ENCOUNTER — Encounter: Payer: Self-pay | Admitting: Physical Therapy

## 2020-08-11 ENCOUNTER — Other Ambulatory Visit: Payer: Self-pay

## 2020-08-11 ENCOUNTER — Ambulatory Visit: Payer: BC Managed Care – PPO | Admitting: Physical Therapy

## 2020-08-11 DIAGNOSIS — M545 Low back pain, unspecified: Secondary | ICD-10-CM | POA: Diagnosis not present

## 2020-08-11 DIAGNOSIS — G8929 Other chronic pain: Secondary | ICD-10-CM

## 2020-08-11 DIAGNOSIS — M6281 Muscle weakness (generalized): Secondary | ICD-10-CM | POA: Diagnosis not present
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
## 2020-08-11 NOTE — Patient Instructions (Addendum)
STRETCHING THE PELVIC FLOOR MUSCLES NO DILATOR  Supplies . Vaginal lubricant . Mirror (optional) . Gloves (optional) or clean hands Positioning . Start in a semi-reclined position with your head propped up. Bend your knees and place your thumb or finger at the vaginal opening. Procedure . Apply a moderate amount of lubricant on the outer skin of your vagina, the labia minora.  Apply additional lubricant to your finger. . Spread the skin away from the vaginal opening. Place the end of your finger at the opening. . Do a maximum contraction of the pelvic floor muscles. Tighten the vagina and the anus maximally and relax. . When you know they are relaxed, gently and slowly insert your finger into your vagina, directing your finger slightly downward, for 2-3 inches of insertion. . Relax and stretch the 6 o'clock position . Hold each stretch for _30-60 seconds, no pain more than 3/10 . Repeat the stretching in the 4 o'clock and 8 o'clock positions. . Next gently move your finger in a "U" shape  several times.  . You can also enter a second finger to work to spread the vaginal opening wider from 3:00-6:00 and 6:00-9:00 or 3:00-9:00 . Perform daily or every other day . Once you have accomplished the techniques you may try them in standing with one foot resting on the tub, or in other positions.  This is a good stretch to do in the shower if you don't need to use lubricant.  This can be done at 35 weeks or later in your pregnancy.   

## 2020-08-15 ENCOUNTER — Other Ambulatory Visit: Payer: Self-pay

## 2020-08-15 ENCOUNTER — Encounter: Payer: Self-pay | Admitting: Physical Therapy

## 2020-08-15 ENCOUNTER — Ambulatory Visit: Payer: BC Managed Care – PPO | Admitting: Physical Therapy

## 2020-08-15 DIAGNOSIS — M6281 Muscle weakness (generalized): Secondary | ICD-10-CM | POA: Diagnosis not present

## 2020-08-15 DIAGNOSIS — G8929 Other chronic pain: Secondary | ICD-10-CM | POA: Diagnosis not present

## 2020-08-15 DIAGNOSIS — M545 Low back pain, unspecified: Secondary | ICD-10-CM | POA: Diagnosis not present
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
## 2020-08-16 DIAGNOSIS — D259 Leiomyoma of uterus, unspecified: Secondary | ICD-10-CM | POA: Diagnosis not present

## 2020-08-18 ENCOUNTER — Ambulatory Visit: Payer: BC Managed Care – PPO | Admitting: Physical Therapy

## 2020-08-18 ENCOUNTER — Telehealth: Payer: Self-pay | Admitting: Physical Therapy

## 2020-08-18 NOTE — Telephone Encounter (Signed)
Pt was called due to no show.  She knew immediately when answering the phone that she had forgot. States she was out working in the garden and lost track of time.  Very apologetic and she would like to come to her appointment next week.  Gustavus Bryant, PT 08/18/20 12:19 PM

## 2020-08-25 ENCOUNTER — Ambulatory Visit: Payer: BC Managed Care – PPO | Admitting: Physical Therapy

## 2020-08-25 ENCOUNTER — Other Ambulatory Visit: Payer: Self-pay

## 2020-08-25 ENCOUNTER — Encounter: Payer: Self-pay | Admitting: Physical Therapy

## 2020-08-25 DIAGNOSIS — R102 Pelvic and perineal pain: Secondary | ICD-10-CM | POA: Diagnosis not present

## 2020-08-25 DIAGNOSIS — M545 Low back pain, unspecified: Secondary | ICD-10-CM | POA: Diagnosis not present

## 2020-08-25 DIAGNOSIS — G8929 Other chronic pain: Secondary | ICD-10-CM | POA: Diagnosis not present

## 2020-08-25 DIAGNOSIS — M6281 Muscle weakness (generalized): Secondary | ICD-10-CM

## 2020-08-25 DIAGNOSIS — D259 Leiomyoma of uterus, unspecified: Secondary | ICD-10-CM | POA: Diagnosis not present

## 2020-08-25 DIAGNOSIS — N946 Dysmenorrhea, unspecified: Secondary | ICD-10-CM | POA: Diagnosis not present
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
## 2020-08-29 ENCOUNTER — Ambulatory Visit: Payer: BC Managed Care – PPO | Admitting: Physical Therapy

## 2020-08-29 ENCOUNTER — Encounter: Payer: Self-pay | Admitting: Physical Therapy

## 2020-08-29 ENCOUNTER — Other Ambulatory Visit: Payer: Self-pay

## 2020-08-29 DIAGNOSIS — G8929 Other chronic pain: Secondary | ICD-10-CM

## 2020-08-29 DIAGNOSIS — M6281 Muscle weakness (generalized): Secondary | ICD-10-CM | POA: Diagnosis not present

## 2020-08-29 DIAGNOSIS — M545 Low back pain, unspecified: Secondary | ICD-10-CM
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
## 2020-08-29 NOTE — Patient Instructions (Addendum)
Scar Massage  Scar massage is done to improve the mobility of scar, decrease scar tissue from building up, reduce adhesions, and prevent Keloids from forming. Start scar massage after scabs have fallen off by themselves and no open areas. The first few weeks after surgery, it is normal for a scar to appear pink or red and slightly raised. Scars can itch or have areas of numbness. Some scars may be sensitive.   Direct Scar massage: after scar is healed, no opening, no scab 1.  Place pads of two fingers together directly on the scar starting at one end of the scar. Move the fingers up and down across the scar holding 5 seconds one direction.  Then go opposite direction hold 5 seconds.  2. Move over to the next section of the scar and repeat.  Work your way along the entire length of the scar.   3. Next make diagonal movements along the scar holding 5 seconds at one direction. 4. Next movement is side to side. 5. Do not rub fingers over the scar.  Instead keep firm pressure and move scar over the tissue it is on top   Scar Lift and Roll 12 weeks after surgery. 1. Pinch a small amount of the scar between your first two fingers and thumb.  2. Roll the scar between your fingers for 5 to 15 seconds. 3. Move along the scar and repeat until you have massaged the entire length of scar.   Stop the massage and call your doctor if you notice: 1. Increased redness 2. Bleeding from scar 3. Seepage coming from the scar 4. Scar is warmer and has increased pain   [facility] [address], [address] [place], [place] [address] Phone # [phone] Fax [phone]  

## 2020-09-04 DIAGNOSIS — R519 Headache, unspecified: Secondary | ICD-10-CM | POA: Diagnosis not present

## 2020-09-04 DIAGNOSIS — A088 Other specified intestinal infections: Secondary | ICD-10-CM | POA: Diagnosis not present

## 2020-09-04 DIAGNOSIS — Z79899 Other long term (current) drug therapy: Secondary | ICD-10-CM | POA: Diagnosis not present

## 2020-09-04 DIAGNOSIS — R112 Nausea with vomiting, unspecified: Secondary | ICD-10-CM | POA: Diagnosis not present

## 2020-09-04 DIAGNOSIS — A084 Viral intestinal infection, unspecified: Secondary | ICD-10-CM | POA: Diagnosis not present

## 2020-09-04 DIAGNOSIS — R197 Diarrhea, unspecified: Secondary | ICD-10-CM | POA: Diagnosis not present

## 2020-09-04 DIAGNOSIS — F17290 Nicotine dependence, other tobacco product, uncomplicated: Secondary | ICD-10-CM | POA: Diagnosis not present

## 2020-09-04 DIAGNOSIS — G43801 Other migraine, not intractable, with status migrainosus: Secondary | ICD-10-CM | POA: Diagnosis not present

## 2020-09-07 DIAGNOSIS — Z01818 Encounter for other preprocedural examination: Secondary | ICD-10-CM | POA: Diagnosis not present

## 2020-09-15 ENCOUNTER — Other Ambulatory Visit: Payer: Self-pay | Admitting: Orthopaedic Surgery

## 2020-09-15 NOTE — Telephone Encounter (Signed)
Please advise 

## 2020-09-15 NOTE — Telephone Encounter (Signed)
Approved refill

## 2020-10-31 DIAGNOSIS — Z6827 Body mass index (BMI) 27.0-27.9, adult: Secondary | ICD-10-CM | POA: Diagnosis not present

## 2020-10-31 DIAGNOSIS — N926 Irregular menstruation, unspecified: Secondary | ICD-10-CM | POA: Diagnosis not present

## 2020-10-31 DIAGNOSIS — G43909 Migraine, unspecified, not intractable, without status migrainosus: Secondary | ICD-10-CM | POA: Diagnosis not present

## 2020-10-31 DIAGNOSIS — R112 Nausea with vomiting, unspecified: Secondary | ICD-10-CM | POA: Diagnosis not present

## 2020-10-31 DIAGNOSIS — M62838 Other muscle spasm: Secondary | ICD-10-CM | POA: Diagnosis not present

## 2020-11-02 DIAGNOSIS — R002 Palpitations: Secondary | ICD-10-CM | POA: Diagnosis not present

## 2020-11-02 DIAGNOSIS — I493 Ventricular premature depolarization: Secondary | ICD-10-CM | POA: Diagnosis not present

## 2020-11-05 DIAGNOSIS — M545 Low back pain, unspecified: Secondary | ICD-10-CM | POA: Diagnosis not present

## 2020-11-05 DIAGNOSIS — M62830 Muscle spasm of back: Secondary | ICD-10-CM | POA: Diagnosis not present

## 2020-11-15 DIAGNOSIS — Z01818 Encounter for other preprocedural examination: Secondary | ICD-10-CM | POA: Diagnosis not present

## 2020-11-15 DIAGNOSIS — N946 Dysmenorrhea, unspecified: Secondary | ICD-10-CM | POA: Diagnosis not present

## 2020-11-15 DIAGNOSIS — D219 Benign neoplasm of connective and other soft tissue, unspecified: Secondary | ICD-10-CM | POA: Diagnosis not present
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
## 2020-11-22 NOTE — Pre-Procedure Instructions (Signed)
Kayla Gross  11/22/2020      [id number] [address] [id number]: [phone]: [phone]   Your procedure is scheduled on Aug. 24.  Report to [facility] 8:10 A.M.  Call this number if you have problems the morning of surgery:  [phone]   Remember:   Do not eat after midnight.   You may drink clear liquids until 7:10 A.M. .  Clear liquids allowed are:                    Water, Juice (non-citric and without pulp - diabetics please choose diet or no sugar options), Carbonated beverages - (diabetics please choose diet or no sugar options), Clear Tea, Black Coffee only (no creamer, milk or cream including half and half), Plain Jell-O only (diabetics please choose diet or no sugar options), Gatorade (diabetics please choose diet or no sugar options), and Plain Popsicles only    Take these medicines the morning of surgery with A SIP OF WATER :                        Tylenol if needed              Cyclobenzaprine (flexeril) if needed              Metoprolol succinate (toprol-xl)              Zofran if needed              Sumatriptan (imitrex) if needed                7 days prior to surgery STOP taking any Aspirin (unless otherwise instructed by your surgeon), Aleve, Naproxen, Ibuprofen, Motrin, Advil, Goody's, BC's, all herbal medications, fish oil, and all vitamins.                 Do not wear jewelry, make-up or nail polish.  Do not wear lotions, powders, or perfumes, or deodorant.  Do not shave 48 hours prior to surgery.  Men may shave face and neck.  Do not bring valuables to the hospital.  [facility] any belongings or [facility]ables.  Contacts, dentures or bridgework may not be worn into surgery.  Leave your suitcase in the car.  After surgery it may be brought to your room.  For patients admitted to the hospital,  discharge time will be determined by your treatment team.  Patients discharged the day of surgery will not be allowed to drive home.    - Preparing For Surgery  Before surgery, you can play an important role. Because skin is not sterile, your skin needs to be as free of germs as possible. You can reduce the number of germs on your skin by washing with CHG (chlorahexidine gluconate) Soap before surgery.  CHG is an antiseptic cleaner which kills germs and bonds with the skin to continue killing germs even after washing.    Oral Hygiene is also important to reduce your risk of infection.  Remember - BRUSH YOUR TEETH THE MORNING OF SURGERY WITH YOUR REGULAR TOOTHPASTE  Please do not use if you have an allergy to CHG or antibacterial soaps. If your skin becomes reddened/irritated stop using the CHG.  Do not shave (including legs and underarms) for at least 48  hours prior to first CHG shower. It is OK to shave your face.  Please follow these instructions carefully.   Shower the NIGHT BEFORE SURGERY and the MORNING OF SURGERY with CHG.   If you chose to wash your hair, wash your hair first as usual with your normal shampoo.  After you shampoo, rinse your hair and body thoroughly to remove the shampoo.  Use CHG as you would any other liquid soap. You can apply CHG directly to the skin and wash gently with a scrungie or a clean washcloth.   Apply the CHG Soap to your body ONLY FROM THE NECK DOWN.  Do not use on open wounds or open sores. Avoid contact with your eyes, ears, mouth and genitals (private parts). Wash Face and genitals (private parts)  with your normal soap.  Wash thoroughly, paying special attention to the area where your surgery will be performed.  Thoroughly rinse your body with warm water from the neck down.  DO NOT shower/wash with your normal soap after using and rinsing off the CHG Soap.  Pat yourself dry with a CLEAN TOWEL.  Wear CLEAN PAJAMAS to bed the night  before surgery, wear comfortable clothes the morning of surgery  Place CLEAN SHEETS on your bed the night of your first shower and DO NOT SLEEP WITH PETS.    Day of Surgery:  Do not apply any deodorants/lotions.  Please wear clean clothes to the hospital/surgery center.   Remember to brush your teeth WITH YOUR REGULAR TOOTHPASTE.   Please read over the following fact sheets that you were given.

## 2020-11-23 ENCOUNTER — Encounter (HOSPITAL_COMMUNITY): Payer: Self-pay

## 2020-11-23 ENCOUNTER — Encounter (HOSPITAL_COMMUNITY)
Admission: RE | Admit: 2020-11-23 | Discharge: 2020-11-23 | Disposition: A | Discharge status: Home / Self Care | Payer: BC Managed Care – PPO | Source: Ambulatory Visit | Attending: Obstetrics and Gynecology | Admitting: Obstetrics and Gynecology

## 2020-11-23 ENCOUNTER — Other Ambulatory Visit: Payer: Self-pay

## 2020-11-23 DIAGNOSIS — Z01812 Encounter for preprocedural laboratory examination: Secondary | ICD-10-CM | POA: Diagnosis not present

## 2020-11-23 LAB — CBC
HCT: 41.4 % (ref 36.0–46.0)
Hemoglobin: 14.2 g/dL (ref 12.0–15.0)
MCH: 31.3 pg (ref 26.0–34.0)
MCHC: 34.3 g/dL (ref 30.0–36.0)
MCV: 91.2 fL (ref 80.0–100.0)
Platelets: 325 10*3/uL (ref 150–400)
RBC: 4.54 MIL/uL (ref 3.87–5.11)
RDW: 12.1 % (ref 11.5–15.5)
WBC: 9.1 10*3/uL (ref 4.0–10.5)
nRBC: 0 % (ref 0.0–0.2)

## 2020-11-23 LAB — TYPE AND SCREEN
ABO/RH(D): O POS
Antibody Screen: NEGATIVE

## 2020-11-23 LAB — BASIC METABOLIC PANEL
Anion gap: 9 (ref 5–15)
BUN: 8 mg/dL (ref 6–20)
CO2: 25 mmol/L (ref 22–32)
Calcium: 9.8 mg/dL (ref 8.9–10.3)
Chloride: 101 mmol/L (ref 98–111)
Creatinine, Ser: 0.72 mg/dL (ref 0.44–1.00)
GFR, Estimated: >60 mL/min (ref >60)
Glucose, Bld: 95 mg/dL (ref 70–99)
Potassium: 3.9 mmol/L (ref 3.5–5.1)
Sodium: 135 mmol/L (ref 135–145)

## 2020-11-23 NOTE — Progress Notes (Signed)
PCP - Kristen Cardinal Cardiologist - Marina Goodell, MD   PPM/ICD - denies Device Orders -  Rep Notified -   Chest x-ray -  EKG - 10/28/20 Stress Test - 02/02/19 ECHO - 04/06/19 Cardiac Cath - 02/18/19  Sleep Study - no CPAP -   Fasting Blood Sugar - n/a Checks Blood Sugar _____ times a day  Blood Thinner Instructions:n/a Aspirin Instructions:n/a  ERAS Protcol - PRE-SURGERY Ensure or G2-   COVID TEST- pt given instructions and directions to get Covid test on Monday August 22.   Anesthesia review: yes-cardiac history  Patient denies shortness of breath, fever, cough and chest pain at PAT appointment   All instructions explained to the patient, with a verbal understanding of the material. Patient agrees to go over the instructions while at home for a better understanding. Patient also instructed to self quarantine after being tested for COVID-19. The opportunity to ask questions was provided.

## 2020-11-28 ENCOUNTER — Other Ambulatory Visit: Payer: Self-pay | Admitting: Obstetrics and Gynecology

## 2020-11-28 LAB — SARS CORONAVIRUS 2 (TAT 6-24 HRS): SARS Coronavirus 2: NEGATIVE

## 2020-11-30 ENCOUNTER — Ambulatory Visit (HOSPITAL_COMMUNITY): Payer: BC Managed Care – PPO | Admitting: Vascular Surgery

## 2020-11-30 ENCOUNTER — Encounter (HOSPITAL_COMMUNITY)
Admission: RE | Disposition: A | Discharge status: Home / Self Care | Payer: Self-pay | Source: Home / Self Care | Attending: Obstetrics and Gynecology

## 2020-11-30 ENCOUNTER — Ambulatory Visit (HOSPITAL_COMMUNITY)
Admission: RE | Admit: 2020-11-30 | Discharge: 2020-12-01 | Disposition: A | Discharge status: Home / Self Care | Payer: BC Managed Care – PPO | Attending: Obstetrics and Gynecology | Admitting: Obstetrics and Gynecology

## 2020-11-30 ENCOUNTER — Encounter (HOSPITAL_COMMUNITY): Payer: Self-pay | Admitting: Obstetrics and Gynecology

## 2020-11-30 ENCOUNTER — Other Ambulatory Visit: Payer: Self-pay

## 2020-11-30 DIAGNOSIS — D282 Benign neoplasm of uterine tubes and ligaments: Secondary | ICD-10-CM | POA: Diagnosis not present

## 2020-11-30 DIAGNOSIS — N946 Dysmenorrhea, unspecified: Secondary | ICD-10-CM | POA: Diagnosis not present

## 2020-11-30 DIAGNOSIS — D251 Intramural leiomyoma of uterus: Secondary | ICD-10-CM | POA: Diagnosis not present

## 2020-11-30 DIAGNOSIS — E559 Vitamin D deficiency, unspecified: Secondary | ICD-10-CM | POA: Diagnosis not present

## 2020-11-30 DIAGNOSIS — I10 Essential (primary) hypertension: Secondary | ICD-10-CM | POA: Diagnosis not present

## 2020-11-30 DIAGNOSIS — D259 Leiomyoma of uterus, unspecified: Secondary | ICD-10-CM | POA: Diagnosis present

## 2020-11-30 DIAGNOSIS — Z87891 Personal history of nicotine dependence: Secondary | ICD-10-CM | POA: Diagnosis not present

## 2020-11-30 DIAGNOSIS — F418 Other specified anxiety disorders: Secondary | ICD-10-CM | POA: Diagnosis not present

## 2020-11-30 HISTORY — PX: VAGINAL HYSTERECTOMY: SHX2639

## 2020-11-30 LAB — POCT PREGNANCY, URINE: Preg Test, Ur: NEGATIVE

## 2020-11-30 LAB — ABO/RH: ABO/RH(D): O POS

## 2020-11-30 SURGERY — HYSTERECTOMY, VAGINAL
Anesthesia: General

## 2020-11-30 MED ORDER — KETOROLAC TROMETHAMINE 30 MG/ML IJ SOLN: 30.0000 mg | Freq: Once | INTRAMUSCULAR | Status: DC

## 2020-11-30 MED ORDER — LIDOCAINE 2% (20 MG/ML) 5 ML SYRINGE
INTRAMUSCULAR | Status: AC
  Filled 2020-11-30: qty 5

## 2020-11-30 MED ORDER — SCOPOLAMINE 1 MG/3DAYS TD PT72
1.0000 | MEDICATED_PATCH | TRANSDERMAL | Status: DC
  Administered 2020-11-30: 1.5 mg via TRANSDERMAL
  Filled 2020-11-30: qty 1

## 2020-11-30 MED ORDER — KETOROLAC TROMETHAMINE 30 MG/ML IJ SOLN
INTRAMUSCULAR | Status: AC
  Filled 2020-11-30: qty 1

## 2020-11-30 MED ORDER — SIMETHICONE 80 MG PO CHEW: 80.0000 mg | CHEWABLE_TABLET | Freq: Four times a day (QID) | ORAL | Status: DC | PRN

## 2020-11-30 MED ORDER — ORAL CARE MOUTH RINSE: 15.0000 mL | Freq: Once | OROMUCOSAL | Status: AC

## 2020-11-30 MED ORDER — KETOROLAC TROMETHAMINE 30 MG/ML IJ SOLN
INTRAMUSCULAR | Status: DC | PRN
  Administered 2020-11-30: 30 mg via INTRAVENOUS

## 2020-11-30 MED ORDER — LIDOCAINE-EPINEPHRINE 1 %-1:100000 IJ SOLN
INTRAMUSCULAR | Status: DC | PRN
  Administered 2020-11-30: 20 mL

## 2020-11-30 MED ORDER — IBUPROFEN 800 MG PO TABS: 800.0000 mg | ORAL_TABLET | Freq: Three times a day (TID) | ORAL | 0 refills | Status: DC

## 2020-11-30 MED ORDER — MIDAZOLAM HCL 5 MG/5ML IJ SOLN
INTRAMUSCULAR | Status: DC | PRN
  Administered 2020-11-30: 2 mg via INTRAVENOUS

## 2020-11-30 MED ORDER — CEFAZOLIN SODIUM-DEXTROSE 2-4 GM/100ML-% IV SOLN
2.0000 g | INTRAVENOUS | Status: AC
  Administered 2020-11-30: 2 g via INTRAVENOUS
  Filled 2020-11-30: qty 100

## 2020-11-30 MED ORDER — LACTATED RINGERS IV SOLN: INTRAVENOUS | Status: DC

## 2020-11-30 MED ORDER — ONDANSETRON HCL 4 MG PO TABS: 4.0000 mg | ORAL_TABLET | Freq: Four times a day (QID) | ORAL | Status: DC | PRN

## 2020-11-30 MED ORDER — ONDANSETRON HCL 4 MG/2ML IJ SOLN
INTRAMUSCULAR | Status: DC | PRN
  Administered 2020-11-30: 4 mg via INTRAVENOUS

## 2020-11-30 MED ORDER — CHLORHEXIDINE GLUCONATE 0.12 % MT SOLN
15.0000 mL | Freq: Once | OROMUCOSAL | Status: AC
  Administered 2020-11-30: 15 mL via OROMUCOSAL
  Filled 2020-11-30: qty 15

## 2020-11-30 MED ORDER — MORPHINE SULFATE (PF) 2 MG/ML IV SOLN
1.0000 mg | INTRAVENOUS | Status: DC | PRN
Start: 2020-11-30 — End: 2020-12-01
  Administered 2020-11-30: 1 mg via INTRAVENOUS
  Filled 2020-11-30: qty 1

## 2020-11-30 MED ORDER — FENTANYL CITRATE (PF) 250 MCG/5ML IJ SOLN
INTRAMUSCULAR | Status: DC | PRN
  Administered 2020-11-30: 150 ug via INTRAVENOUS
  Administered 2020-11-30: 50 ug via INTRAVENOUS

## 2020-11-30 MED ORDER — ROCURONIUM BROMIDE 10 MG/ML (PF) SYRINGE
PREFILLED_SYRINGE | INTRAVENOUS | Status: AC
  Filled 2020-11-30: qty 10

## 2020-11-30 MED ORDER — FENTANYL CITRATE (PF) 250 MCG/5ML IJ SOLN
INTRAMUSCULAR | Status: AC
  Filled 2020-11-30: qty 5

## 2020-11-30 MED ORDER — FENTANYL CITRATE (PF) 100 MCG/2ML IJ SOLN
25.0000 ug | INTRAMUSCULAR | Status: DC | PRN
  Administered 2020-11-30 (×2): 50 ug via INTRAVENOUS

## 2020-11-30 MED ORDER — LIDOCAINE-EPINEPHRINE 1 %-1:100000 IJ SOLN
INTRAMUSCULAR | Status: AC
  Filled 2020-11-30: qty 1

## 2020-11-30 MED ORDER — METOPROLOL SUCCINATE ER 100 MG PO TB24
100.0000 mg | ORAL_TABLET | Freq: Every day | ORAL | Status: DC
  Administered 2020-12-01: 100 mg via ORAL
  Filled 2020-11-30: qty 1

## 2020-11-30 MED ORDER — PANTOPRAZOLE SODIUM 40 MG IV SOLR
40.0000 mg | Freq: Every day | INTRAVENOUS | Status: DC
  Administered 2020-11-30: 40 mg via INTRAVENOUS
  Filled 2020-11-30: qty 40

## 2020-11-30 MED ORDER — AMISULPRIDE (ANTIEMETIC) 5 MG/2ML IV SOLN: 10.0000 mg | Freq: Once | INTRAVENOUS | Status: DC | PRN

## 2020-11-30 MED ORDER — OXYCODONE HCL 5 MG PO TABS
5.0000 mg | ORAL_TABLET | ORAL | Status: DC | PRN
Start: 2020-11-30 — End: 2020-12-01
  Administered 2020-11-30: 10 mg via ORAL
  Administered 2020-11-30: 5 mg via ORAL
  Administered 2020-12-01 (×3): 10 mg via ORAL
  Filled 2020-11-30: qty 1
  Filled 2020-11-30 (×4): qty 2

## 2020-11-30 MED ORDER — OXYCODONE HCL 5 MG PO TABS: 5.0000 mg | ORAL_TABLET | Freq: Four times a day (QID) | ORAL | 0 refills | Status: DC | PRN

## 2020-11-30 MED ORDER — IBUPROFEN 800 MG PO TABS
800.0000 mg | ORAL_TABLET | Freq: Three times a day (TID) | ORAL | Status: DC
  Administered 2020-11-30 – 2020-12-01 (×3): 800 mg via ORAL
  Filled 2020-11-30 (×3): qty 1

## 2020-11-30 MED ORDER — ACETAMINOPHEN 500 MG PO TABS: 1000.0000 mg | ORAL_TABLET | Freq: Four times a day (QID) | ORAL | Status: DC | PRN

## 2020-11-30 MED ORDER — FENTANYL CITRATE (PF) 100 MCG/2ML IJ SOLN
INTRAMUSCULAR | Status: AC
  Filled 2020-11-30: qty 2

## 2020-11-30 MED ORDER — ONDANSETRON HCL 4 MG/2ML IJ SOLN
INTRAMUSCULAR | Status: AC
  Filled 2020-11-30: qty 2

## 2020-11-30 MED ORDER — ROCURONIUM BROMIDE 10 MG/ML (PF) SYRINGE
PREFILLED_SYRINGE | INTRAVENOUS | Status: DC | PRN
  Administered 2020-11-30: 60 mg via INTRAVENOUS

## 2020-11-30 MED ORDER — DEXAMETHASONE SODIUM PHOSPHATE 10 MG/ML IJ SOLN
INTRAMUSCULAR | Status: DC | PRN
  Administered 2020-11-30: 10 mg via INTRAVENOUS

## 2020-11-30 MED ORDER — ONDANSETRON HCL 4 MG/2ML IJ SOLN: 4.0000 mg | Freq: Four times a day (QID) | INTRAMUSCULAR | Status: DC | PRN

## 2020-11-30 MED ORDER — SUGAMMADEX SODIUM 200 MG/2ML IV SOLN
INTRAVENOUS | Status: DC | PRN
  Administered 2020-11-30: 200 mg via INTRAVENOUS

## 2020-11-30 MED ORDER — LIDOCAINE 2% (20 MG/ML) 5 ML SYRINGE
INTRAMUSCULAR | Status: DC | PRN
  Administered 2020-11-30: 60 mg via INTRAVENOUS

## 2020-11-30 MED ORDER — PROPOFOL 10 MG/ML IV BOLUS
INTRAVENOUS | Status: AC
  Filled 2020-11-30: qty 20

## 2020-11-30 MED ORDER — DEXAMETHASONE SODIUM PHOSPHATE 10 MG/ML IJ SOLN
INTRAMUSCULAR | Status: AC
  Filled 2020-11-30: qty 1

## 2020-11-30 MED ORDER — PROPOFOL 10 MG/ML IV BOLUS
INTRAVENOUS | Status: DC | PRN
  Administered 2020-11-30: 200 mg via INTRAVENOUS

## 2020-11-30 MED ORDER — ACETAMINOPHEN 500 MG PO TABS
1000.0000 mg | ORAL_TABLET | Freq: Once | ORAL | Status: AC
  Administered 2020-11-30: 1000 mg via ORAL
  Filled 2020-11-30: qty 2

## 2020-11-30 MED ORDER — MIDAZOLAM HCL 2 MG/2ML IJ SOLN
INTRAMUSCULAR | Status: AC
  Filled 2020-11-30: qty 2

## 2020-11-30 SURGICAL SUPPLY — 17 items
DRAPE SURG IRRIG POUCH 19X23 (DRAPES) ×2 IMPLANT
GLOVE SURG ENC MOIS LTX SZ6.5 (GLOVE) ×2 IMPLANT
GLOVE SURG UNDER POLY LF SZ6.5 (GLOVE) ×2 IMPLANT
GLOVE SURG UNDER POLY LF SZ7 (GLOVE) ×8 IMPLANT
GOWN STRL REUS W/ TWL LRG LVL3 (GOWN DISPOSABLE) ×4 IMPLANT
GOWN STRL REUS W/TWL LRG LVL3 (GOWN DISPOSABLE) ×4
KIT TURNOVER KIT B (KITS) ×2 IMPLANT
LIGASURE IMPACT 36 18CM CVD LR (INSTRUMENTS) ×2 IMPLANT
NS IRRIG 1000ML POUR BTL (IV SOLUTION) ×2 IMPLANT
PACK VAGINAL WOMENS (CUSTOM PROCEDURE TRAY) ×2 IMPLANT
PAD OB MATERNITY 4.3X12.25 (PERSONAL CARE ITEMS) ×2 IMPLANT
SUT VIC AB 0 CT1 18XCR BRD8 (SUTURE) ×2 IMPLANT
SUT VIC AB 0 CT1 36 (SUTURE) ×4 IMPLANT
SUT VIC AB 0 CT1 8-18 (SUTURE) ×2
SUT VICRYL 1 TIES 12X18 (SUTURE) ×2 IMPLANT
TOWEL GREEN STERILE FF (TOWEL DISPOSABLE) ×2 IMPLANT
TRAY FOLEY W/BAG SLVR 14FR (SET/KITS/TRAYS/PACK) ×2 IMPLANT

## 2020-11-30 NOTE — Progress Notes (Signed)
GYN Post-Op Note  Subjective: Patient feeling well. Pain well-controlled. Husband at bedside. Has had clear liquids and has not advanced diet yet. Denies fevers, chills, chest pain, SOB, N/V, or LE edema.  Objective: BP 114/75 (BP Location: Left Arm)   Pulse (!) 59   Temp 98.7 F (37.1 C) (Oral)   Resp 14   Ht '5\' 6"'$  (1.676 m)   Wt 77.6 kg   LMP 11/02/2020 (Approximate)   SpO2 97%   BMI 27.60 kg/m  Gen: NAD, pleasant Pulm: No increased WOB Abd: Soft, non-distended, non-tender Ext: SCDs on and working, no bilateral calf tenderness  A/P: POD#0 TVH/BS for fibroids and dysmenorrhea.  - Doing well post-operatively - Cardio:  Home Metorpolol ordered for history of SVT - Pulm:  RA - GI:  DAT  Prophylaxis: Protonix IV '40mg'$  qHS - Renal:  Foley in place - remove today once ready to ambulate - IVF:  LR at 125cc/hour - Pain:  Motrin/Tylenol/Roxi and Morphine IV for breakthrough pain - DVT Prophylaxis:  SCDs - Activity:  Ambulation - Labs:  None  Reviewed surgical findings and pictures with patient. Anticipate D/C home tomorrow.  Drema Dallas, DO

## 2020-11-30 NOTE — Op Note (Signed)
Pre Op Dx:   1. Uterine fibroids 2. Dysmenorrhea  Post Op Dx:   Same as pre-operative diagnoses  Procedure:  Vaginal hysterectomy, bilateral salpingectomy  Surgeon:  Dr. Drema Dallas Assistants:  Dr. Christophe Louis (assistant needed due to the complexity of the anatomy) Anesthesia:  General  EBL:  75cc  IVF:  700cc UOP:  250cc  Drains:  Foley catheter Specimen removed:  Uterus, cervix, and bilateral fallopian tubes Device(s) implanted:  None Case Type:  Clean-contaminated Findings: Enlarged ~12cm fibroid uterus. Normal-appearing bilateral fallopian tubes and ovaries. Complications: None Indications:  40 y.o. G4P4 with uterine fibroids, dysmenorrhea, and low back pain who desired definitive surgical management via hysterectomy. Description of procedure: After informed consent was obtained the patient was brought to the operating room.  She was placed in dorsal supine position and anesthesia was administered.  She was placed in dorsal lithotomy position and prepped and draped in the usual sterile fashion.  A Foley catheter was placed.  A preoperative timeout was completed.  The cervix was grasped and the vaginal epithelium injected with 1% Lidocaine with epinephrine.  A circumferential colpotomy was created using electrosurgery.  The epithelium was divided further with scissors and blunt dissection.   The anterior vesicouterine peritoneal reflection was identified and opened sharply.  The Deaver retractor was placed. The posterior cul-de-sac was entered sharply.  The uterosacral ligaments were divided and suture ligated with 0-Vicryl.  The Steiner weighted retractor was placed.    The cardinal ligaments with the uterine vasculature were divided using the Ligasure device.  Sequential pedicles of the broad ligament were isolated and divided using the Ligasure device. When the uterine fundus was reached, the fundus was delivered posteriorly but due poor visualization, the uterus was bi-valved  sharply. The cornual pedicles containing the uteroovarian anastomosis, fallopian tube, and round ligament were each divided using the Ligasure. The uterus was removed and passed off the field.  The fallopian tubes were elevated and dissected free of the mesosalpinx using the Ligasure.  Both tubes were removed.  The ovaries appeared normal. Modified McCall Culdoplasty performed in standard fashion. Angle sutures placed through each uterosacral ligament. The cuff was closed with sequential figure-of-eight sutures. Irrigation was performed and hemostasis verified.  She was returned to dorsal supine position, awakened and extubated having appeared to tolerate the procedure well.  All counts were correct.  She was transferred to PACU in good condition.   Disposition:  PACU  Drema Dallas, DO

## 2020-11-30 NOTE — Transfer of Care (Signed)
Immediate Anesthesia Transfer of Care Note  Patient: Kayla Gross  Procedure(s) Performed: HYSTERECTOMY TOTAL VAGINAL WITH BILATERAL SALPINGECTOMY.  Patient Location: PACU  Anesthesia Type:General  Level of Consciousness: oriented, sedated and patient cooperative  Airway & Oxygen Therapy: Patient Spontanous Breathing and Patient connected to nasal cannula oxygen  Post-op Assessment: Report given to RN and Post -op Vital signs reviewed and stable  Post vital signs: Reviewed  Last Vitals:  Vitals Value Taken Time  BP    Temp    Pulse    Resp    SpO2      Last Pain:  Vitals:   11/30/20 0914  TempSrc:   PainSc: 0-No pain         Complications: No notable events documented.

## 2020-11-30 NOTE — Interval H&P Note (Signed)
History and Physical Interval Note:  11/30/2020 10:42 AM  Kayla Gross  has presented today for surgery, with the diagnosis of Dysmenorrhea.  The various methods of treatment have been discussed with the patient and family. After consideration of risks, benefits and other options for treatment, the patient has consented to  Procedure(s): HYSTERECTOMY TOTAL VAGINAL WITH BILATERAL SALPINGECTOMY. (N/A) as a surgical intervention.  The patient's history has been reviewed, patient examined, no change in status, stable for surgery.  I have reviewed the patient's chart and labs.  Questions were answered to the patient's satisfaction.     Drema Dallas

## 2020-11-30 NOTE — Anesthesia Procedure Notes (Addendum)
Procedure Name: Intubation Date/Time: 11/30/2020 11:35 AM Performed by: Jenne Campus, CRNA Pre-anesthesia Checklist: Patient identified, Emergency Drugs available, Suction available and Patient being monitored Patient Re-evaluated:Patient Re-evaluated prior to induction Oxygen Delivery Method: Circle System Utilized Preoxygenation: Pre-oxygenation with 100% oxygen Induction Type: IV induction Ventilation: Mask ventilation without difficulty Laryngoscope Size: Glidescope and 4 Grade View: Grade I Tube type: Oral Tube size: 7.0 mm Number of attempts: 1 Airway Equipment and Method: Stylet and Oral airway Placement Confirmation: ETT inserted through vocal cords under direct vision, positive ETCO2 and breath sounds checked- equal and bilateral Secured at: 21 cm Tube secured with: Tape Dental Injury: Teeth and Oropharynx as per pre-operative assessment  Difficulty Due To: Difficult Airway- due to anterior larynx Comments: Smooth IV induction. Easy mask. DL x 1 CRNA. Mil 3. Grade 4 view. Patient's mouth and airway narrow. Anterior larynx. DL x 1 CRNA Video-glide. Grade 1. Atraumatic oral intubation. +ETCO2 BBSE.

## 2020-11-30 NOTE — Anesthesia Preprocedure Evaluation (Addendum)
Anesthesia Evaluation  Patient identified by MRN, date of birth, ID band Patient awake    Reviewed: Allergy & Precautions, NPO status , Patient's Chart, lab work & pertinent test results, reviewed documented beta blocker date and time   Airway Mallampati: II  TM Distance: >3 FB Neck ROM: Full    Dental  (+) Dental Advisory Given, Poor Dentition   Pulmonary former smoker,    breath sounds clear to auscultation       Cardiovascular hypertension, Pt. on medications + dysrhythmias  Rhythm:Regular Rate:Normal  Ablation for PVC's   Neuro/Psych  Headaches, PSYCHIATRIC DISORDERS    GI/Hepatic negative GI ROS, Neg liver ROS,   Endo/Other  negative endocrine ROS  Renal/GU negative Renal ROS     Musculoskeletal   Abdominal   Peds  Hematology negative hematology ROS (+)   Anesthesia Other Findings   Reproductive/Obstetrics                            Lab Results  Component Value Date   WBC 9.1 11/23/2020   HGB 14.2 11/23/2020   HCT 41.4 11/23/2020   MCV 91.2 11/23/2020   PLT 325 11/23/2020   Lab Results  Component Value Date   CREATININE 0.72 11/23/2020   BUN 8 11/23/2020   NA 135 11/23/2020   K 3.9 11/23/2020   CL 101 11/23/2020   CO2 25 11/23/2020    Anesthesia Physical Anesthesia Plan  ASA: 2  Anesthesia Plan: General   Post-op Pain Management:    Induction: Intravenous  PONV Risk Score and Plan: 4 or greater and Scopolamine patch - Pre-op, Midazolam, Dexamethasone, Ondansetron and Treatment may vary due to age or medical condition  Airway Management Planned: Oral ETT  Additional Equipment:   Intra-op Plan:   Post-operative Plan: Extubation in OR  Informed Consent: I have reviewed the patients History and Physical, chart, labs and discussed the procedure including the risks, benefits and alternatives for the proposed anesthesia with the patient or authorized representative  who has indicated his/her understanding and acceptance.     Dental advisory given  Plan Discussed with: CRNA  Anesthesia Plan Comments:         Anesthesia Quick Evaluation

## 2020-12-01 ENCOUNTER — Encounter (HOSPITAL_COMMUNITY): Payer: Self-pay | Admitting: Obstetrics and Gynecology

## 2020-12-01 DIAGNOSIS — N946 Dysmenorrhea, unspecified: Secondary | ICD-10-CM | POA: Diagnosis not present

## 2020-12-01 DIAGNOSIS — D282 Benign neoplasm of uterine tubes and ligaments: Secondary | ICD-10-CM | POA: Diagnosis not present

## 2020-12-01 DIAGNOSIS — D251 Intramural leiomyoma of uterus: Secondary | ICD-10-CM | POA: Diagnosis not present

## 2020-12-01 DIAGNOSIS — Z87891 Personal history of nicotine dependence: Secondary | ICD-10-CM | POA: Diagnosis not present

## 2020-12-01 NOTE — Discharge Summary (Signed)
Physician Discharge Summary  Patient ID: Kayla Gross MRN: [id number] DOB/AGE: 10-26-1980 40 y.o.  Admit date: 11/30/2020 Discharge date: 12/01/2020  Admission Diagnoses: fibroids  Discharge Diagnoses:  Active Problems:   Fibroid uterus S/p vaginal hysterectomy with bilateral salpingectomy   Discharged Condition: stable  Hospital Course: Pt was admitted for observation after undergoing vaginal hysterectomy. She did well postoperatively with return of bowel and bladder function. She is tolerating po   Consults: None  Significant Diagnostic Studies: labs:  Results for orders placed or performed during the hospital encounter of 11/30/20 (from the past 24 hour(s))  ABO/Rh     Status: None   Collection Time: 11/30/20  9:15 AM  Result Value Ref Range   ABO/RH(D)      O POS Performed at [facility] [address] [place], [place] [address]   Pregnancy, urine POC     Status: None   Collection Time: 11/30/20  9:29 AM  Result Value Ref Range   Preg Test, Ur NEGATIVE NEGATIVE     Treatments: surgery: vaginal hysterectomy with bilateral salpingectomy   Discharge Exam: Blood pressure 109/66, pulse 67, temperature 97.7 F (36.5 C), temperature source Oral, resp. rate 17, height '5\' 6"'$  (1.676 m), weight 77.6 kg, last menstrual period 11/02/2020, SpO2 98 %. General appearance: alert, cooperative, and no distress GI: soft, non-tender; bowel sounds normal; no masses,  no organomegaly Ext SCD's in place   Disposition: Discharge disposition: 01-Home or Self Care        Allergies as of 12/01/2020   No Known Allergies      Medication List     STOP taking these medications    gabapentin 300 MG capsule Commonly known as: NEURONTIN       TAKE these medications    acetaminophen 500 MG tablet Commonly known as: TYLENOL Take 1,000 mg by mouth every 6 (six) hours as needed for moderate pain.   cyclobenzaprine 10 MG tablet Commonly known as: FLEXERIL Take 10 mg by  mouth 3 (three) times daily as needed for muscle spasms.   diclofenac 75 MG EC tablet Commonly known as: VOLTAREN Take 75 mg by mouth 2 (two) times daily.   ibuprofen 800 MG tablet Commonly known as: ADVIL Take 1 tablet (800 mg total) by mouth every 8 (eight) hours.   magnesium oxide 400 MG tablet Commonly known as: MAG-OX Take 200 mg by mouth every 7 (seven) days.   metoprolol succinate 100 MG 24 hr tablet Commonly known as: TOPROL-XL Take 100 mg by mouth daily.   ondansetron 4 MG tablet Commonly known as: ZOFRAN Take 4 mg by mouth every 8 (eight) hours as needed for nausea or vomiting.   oxyCODONE 5 MG immediate release tablet Commonly known as: Oxy IR/ROXICODONE Take 1-2 tablets (5-10 mg total) by mouth every 6 (six) hours as needed for severe pain. What changed: how much to take   SUMAtriptan 50 MG tablet Commonly known as: IMITREX Take 50 mg by mouth every 2 (two) hours as needed for migraine.        Follow-up Information     Drema Dallas, DO Follow up in 2 week(s).   Specialty: Obstetrics and Gynecology Why: Please keep your post-operative visit with Dr. Delora Fuel as previously scheduled. Contact information: [address] [place] [address] [place] [address] [phone]                 Signed: Christophe Louis 12/01/2020, 6:08 AM

## 2020-12-01 NOTE — Progress Notes (Signed)
Kayla Gross to be D/C'd  per MD order.  Discussed with the patient and all questions fully answered.  VSS, Skin clean, dry and intact without evidence of skin break down, no evidence of skin tears noted.  IV catheter discontinued intact. Site without signs and symptoms of complications. Dressing and pressure applied.  An After Visit Summary was printed and given to the patient. Patient received prescription.  D/c education completed with patient/family including follow up instructions, medication list, d/c activities limitations if indicated, with other d/c instructions as indicated by MD - patient able to verbalize understanding, all questions fully answered.   Patient instructed to return to ED, call 911, or call MD for any changes in condition.   Patient to be escorted via [place], and D/C home via private auto.

## 2020-12-02 NOTE — Anesthesia Postprocedure Evaluation (Signed)
Anesthesia Post Note  Patient: Kayla Gross  Procedure(s) Performed: HYSTERECTOMY TOTAL VAGINAL WITH BILATERAL SALPINGECTOMY.     Patient location during evaluation: PACU Anesthesia Type: General Level of consciousness: awake and alert Pain management: pain level controlled Vital Signs Assessment: post-procedure vital signs reviewed and stable Respiratory status: spontaneous breathing, nonlabored ventilation, respiratory function stable and patient connected to nasal cannula oxygen Cardiovascular status: blood pressure returned to baseline and stable Postop Assessment: no apparent nausea or vomiting Anesthetic complications: no   No notable events documented.  Last Vitals:  Vitals:   12/01/20 0417 12/01/20 0941  BP: 109/66 122/78  Pulse: 67 (!) 59  Resp: 17   Temp: 36.5 C 36.8 C  SpO2: 98% 99%    Last Pain:  Vitals:   12/01/20 0941  TempSrc: Oral  PainSc:                  Kayla Gross

## 2020-12-05 LAB — SURGICAL PATHOLOGY

## 2020-12-08 ENCOUNTER — Other Ambulatory Visit: Payer: Self-pay | Admitting: Orthopaedic Surgery

## 2020-12-15 DIAGNOSIS — E559 Vitamin D deficiency, unspecified: Secondary | ICD-10-CM | POA: Diagnosis not present

## 2020-12-15 DIAGNOSIS — Z13220 Encounter for screening for lipoid disorders: Secondary | ICD-10-CM | POA: Diagnosis not present

## 2020-12-15 DIAGNOSIS — Z23 Encounter for immunization: Secondary | ICD-10-CM | POA: Diagnosis not present

## 2020-12-15 DIAGNOSIS — Z79899 Other long term (current) drug therapy: Secondary | ICD-10-CM | POA: Diagnosis not present

## 2020-12-15 DIAGNOSIS — Z0000 Encounter for general adult medical examination without abnormal findings: Secondary | ICD-10-CM | POA: Diagnosis not present

## 2021-03-08 DIAGNOSIS — H20012 Primary iridocyclitis, left eye: Secondary | ICD-10-CM | POA: Diagnosis not present

## 2021-03-23 DIAGNOSIS — H20012 Primary iridocyclitis, left eye: Secondary | ICD-10-CM | POA: Diagnosis not present

## 2021-04-28 DIAGNOSIS — M545 Low back pain, unspecified: Secondary | ICD-10-CM | POA: Diagnosis not present

## 2021-04-28 DIAGNOSIS — G43709 Chronic migraine without aura, not intractable, without status migrainosus: Secondary | ICD-10-CM | POA: Diagnosis not present

## 2021-05-11 ENCOUNTER — Other Ambulatory Visit: Payer: Self-pay

## 2021-05-11 ENCOUNTER — Ambulatory Visit (INDEPENDENT_AMBULATORY_CARE_PROVIDER_SITE_OTHER): Payer: BC Managed Care – PPO | Admitting: Orthopaedic Surgery

## 2021-05-11 ENCOUNTER — Encounter: Payer: Self-pay | Admitting: Orthopaedic Surgery

## 2021-05-11 DIAGNOSIS — M5441 Lumbago with sciatica, right side: Secondary | ICD-10-CM | POA: Diagnosis not present

## 2021-05-11 DIAGNOSIS — M5442 Lumbago with sciatica, left side: Secondary | ICD-10-CM

## 2021-05-11 DIAGNOSIS — M25552 Pain in left hip: Secondary | ICD-10-CM

## 2021-05-11 DIAGNOSIS — G8929 Other chronic pain: Secondary | ICD-10-CM

## 2021-05-11 NOTE — Progress Notes (Signed)
The patient comes in today to evaluate continued left-sided pelvic pain.  Since I saw her last spring she has had a hysterectomy for uterine fibroids.  I originally saw her for left hip pain but her left hip exam was normal and a pelvic MRI showed a large S2 sacral cyst.  All of her pain seems to be over the ischium and not part of her pelvis.  On exam today she is uncomfortable and I can easily put her left hip through internal ex rotation with no pain in the groin at all.  I did show her the MRI from last year which showed this large cystic structure near the neuroforamina at S2 to the left side.  I did not see any worrisome features around the ischium in terms of the hamstring origin and again her left hip exam is normal.  At this point I would like to send her to neurosurgery to evaluate this large S2 sacral cyst to see what recommendations that they may have because of mental loss from my standpoint what to recommend.  It may be that radiology can perform a CT-guided aspiration of this to decompress it but I would like to see neurosurgery's opinion as well.  She agrees to this referral.  Of note she is now having occasional "warm sensation" that occurs on her left ankle and foot on occasion.

## 2021-06-15 DIAGNOSIS — M542 Cervicalgia: Secondary | ICD-10-CM | POA: Diagnosis not present

## 2021-06-15 DIAGNOSIS — M5440 Lumbago with sciatica, unspecified side: Secondary | ICD-10-CM | POA: Diagnosis not present

## 2021-07-19 ENCOUNTER — Other Ambulatory Visit: Payer: Self-pay | Admitting: Orthopaedic Surgery

## 2021-07-19 DIAGNOSIS — G43909 Migraine, unspecified, not intractable, without status migrainosus: Secondary | ICD-10-CM | POA: Diagnosis not present

## 2021-07-19 DIAGNOSIS — R110 Nausea: Secondary | ICD-10-CM | POA: Diagnosis not present

## 2021-08-14 DIAGNOSIS — H20011 Primary iridocyclitis, right eye: Secondary | ICD-10-CM | POA: Diagnosis not present

## 2021-08-16 DIAGNOSIS — M5451 Vertebrogenic low back pain: Secondary | ICD-10-CM | POA: Diagnosis not present

## 2021-08-16 DIAGNOSIS — M542 Cervicalgia: Secondary | ICD-10-CM | POA: Diagnosis not present

## 2021-08-21 DIAGNOSIS — H20011 Primary iridocyclitis, right eye: Secondary | ICD-10-CM | POA: Diagnosis not present

## 2021-08-27 DIAGNOSIS — M542 Cervicalgia: Secondary | ICD-10-CM | POA: Diagnosis not present

## 2021-08-28 DIAGNOSIS — Z1589 Genetic susceptibility to other disease: Secondary | ICD-10-CM | POA: Diagnosis not present

## 2021-08-28 DIAGNOSIS — R5383 Other fatigue: Secondary | ICD-10-CM | POA: Diagnosis not present

## 2021-08-28 DIAGNOSIS — H2013 Chronic iridocyclitis, bilateral: Secondary | ICD-10-CM | POA: Diagnosis not present

## 2021-08-28 DIAGNOSIS — M5440 Lumbago with sciatica, unspecified side: Secondary | ICD-10-CM | POA: Diagnosis not present

## 2021-08-28 DIAGNOSIS — Z111 Encounter for screening for respiratory tuberculosis: Secondary | ICD-10-CM | POA: Diagnosis not present

## 2021-08-31 DIAGNOSIS — M542 Cervicalgia: Secondary | ICD-10-CM | POA: Diagnosis not present

## 2021-08-31 DIAGNOSIS — M5451 Vertebrogenic low back pain: Secondary | ICD-10-CM | POA: Diagnosis not present

## 2021-09-10 ENCOUNTER — Encounter: Payer: Self-pay | Admitting: Emergency Medicine

## 2021-09-10 ENCOUNTER — Ambulatory Visit
Admission: EM | Admit: 2021-09-10 | Discharge: 2021-09-10 | Disposition: A | Discharge status: Home / Self Care | Payer: BC Managed Care – PPO | Attending: Emergency Medicine | Admitting: Emergency Medicine

## 2021-09-10 DIAGNOSIS — G44201 Tension-type headache, unspecified, intractable: Secondary | ICD-10-CM

## 2021-09-10 DIAGNOSIS — M542 Cervicalgia: Secondary | ICD-10-CM | POA: Diagnosis not present

## 2021-09-10 DIAGNOSIS — G43911 Migraine, unspecified, intractable, with status migrainosus: Secondary | ICD-10-CM | POA: Diagnosis not present

## 2021-09-10 DIAGNOSIS — Z1589 Genetic susceptibility to other disease: Secondary | ICD-10-CM

## 2021-09-10 MED ORDER — ONDANSETRON 8 MG PO TBDP
8.0000 mg | ORAL_TABLET | Freq: Once | ORAL | Status: AC
Start: 2021-09-10 — End: 2021-09-10
  Administered 2021-09-10: 8 mg via ORAL

## 2021-09-10 MED ORDER — KETOROLAC TROMETHAMINE 60 MG/2ML IM SOLN
60.0000 mg | Freq: Once | INTRAMUSCULAR | Status: AC
  Administered 2021-09-10: 60 mg via INTRAMUSCULAR

## 2021-09-10 MED ORDER — METHYLPREDNISOLONE 8 MG PO TABS: ORAL_TABLET | ORAL | 0 refills | Status: DC

## 2021-09-10 MED ORDER — SUMATRIPTAN SUCCINATE 50 MG PO TABS: 50.0000 mg | ORAL_TABLET | ORAL | 0 refills | Status: AC | PRN

## 2021-09-10 MED ORDER — BACLOFEN 10 MG PO TABS: 10.0000 mg | ORAL_TABLET | Freq: Every day | ORAL | 0 refills | Status: AC

## 2021-09-10 MED ORDER — HYDROCODONE-ACETAMINOPHEN 5-325 MG PO TABS: 2.0000 | ORAL_TABLET | ORAL | 0 refills | Status: AC | PRN

## 2021-09-10 NOTE — Discharge Instructions (Signed)
Please see the medications and their dosage instructions in your checkout sheet.  The baclofen is a muscle laxer that you should take about an hour prior to bedtime to release the spasm in the muscles on both sides of your neck which is the likely source of your tension headache which triggered her migraine.  Methylprednisolone to provide significant relief of the inflammation of your reactive arthritis, possible ankylosing spondylitis, I recommend that you finish the full course.  I renewed your prescription for sumatriptan in case you have another headache.  Please discuss the monthly suppressive injections for migraines called in Enjovy or Aimovig.  They are highly effective I strongly recommend you consider using them to suppress migraines.  They are also very useful for episodic tension headaches as well.  I am glad you are feeling better.  If you would like to return in a few days for another ketorolac injection, please feel free to do so.  Thank you for visiting urgent care today.

## 2021-09-10 NOTE — ED Triage Notes (Signed)
Pt here with 5 day hx of migraine with nausea and some emesis. This is the second migraine attack in one month. Pt usually takes sumitriptan, but it has stopped working. Pt does not have any zofran prescribed for the nausea that accompanies her migraines.
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
## 2021-09-11 DIAGNOSIS — M542 Cervicalgia: Secondary | ICD-10-CM | POA: Diagnosis not present

## 2021-09-11 DIAGNOSIS — M5451 Vertebrogenic low back pain: Secondary | ICD-10-CM | POA: Diagnosis not present

## 2021-09-14 DIAGNOSIS — M5451 Vertebrogenic low back pain: Secondary | ICD-10-CM | POA: Diagnosis not present

## 2021-09-14 DIAGNOSIS — M542 Cervicalgia: Secondary | ICD-10-CM | POA: Diagnosis not present

## 2021-09-18 DIAGNOSIS — M5451 Vertebrogenic low back pain: Secondary | ICD-10-CM | POA: Diagnosis not present

## 2021-09-18 DIAGNOSIS — M542 Cervicalgia: Secondary | ICD-10-CM | POA: Diagnosis not present

## 2021-09-20 DIAGNOSIS — H2013 Chronic iridocyclitis, bilateral: Secondary | ICD-10-CM | POA: Diagnosis not present

## 2021-09-20 DIAGNOSIS — G43709 Chronic migraine without aura, not intractable, without status migrainosus: Secondary | ICD-10-CM | POA: Diagnosis not present

## 2021-09-20 DIAGNOSIS — M5410 Radiculopathy, site unspecified: Secondary | ICD-10-CM | POA: Diagnosis not present

## 2021-09-21 DIAGNOSIS — M542 Cervicalgia: Secondary | ICD-10-CM | POA: Diagnosis not present

## 2021-09-21 DIAGNOSIS — M5030 Other cervical disc degeneration, unspecified cervical region: Secondary | ICD-10-CM | POA: Diagnosis not present

## 2021-09-21 DIAGNOSIS — M5451 Vertebrogenic low back pain: Secondary | ICD-10-CM | POA: Diagnosis not present

## 2021-09-21 DIAGNOSIS — M5440 Lumbago with sciatica, unspecified side: Secondary | ICD-10-CM | POA: Diagnosis not present

## 2021-09-21 DIAGNOSIS — Z1589 Genetic susceptibility to other disease: Secondary | ICD-10-CM | POA: Diagnosis not present

## 2021-09-21 DIAGNOSIS — H2013 Chronic iridocyclitis, bilateral: Secondary | ICD-10-CM | POA: Diagnosis not present

## 2021-09-25 DIAGNOSIS — M542 Cervicalgia: Secondary | ICD-10-CM | POA: Diagnosis not present

## 2021-09-25 DIAGNOSIS — M5451 Vertebrogenic low back pain: Secondary | ICD-10-CM | POA: Diagnosis not present

## 2021-09-28 DIAGNOSIS — M542 Cervicalgia: Secondary | ICD-10-CM | POA: Diagnosis not present

## 2021-09-28 DIAGNOSIS — M5451 Vertebrogenic low back pain: Secondary | ICD-10-CM | POA: Diagnosis not present

## 2021-09-30 ENCOUNTER — Encounter: Payer: Self-pay | Admitting: Urgent Care

## 2021-09-30 ENCOUNTER — Ambulatory Visit
Admission: EM | Admit: 2021-09-30 | Discharge: 2021-09-30 | Disposition: A | Discharge status: Home / Self Care | Payer: BC Managed Care – PPO | Attending: Urgent Care | Admitting: Urgent Care

## 2021-09-30 DIAGNOSIS — M542 Cervicalgia: Secondary | ICD-10-CM

## 2021-09-30 DIAGNOSIS — G8929 Other chronic pain: Secondary | ICD-10-CM

## 2021-09-30 DIAGNOSIS — R519 Headache, unspecified: Secondary | ICD-10-CM

## 2021-09-30 MED ORDER — DEXAMETHASONE SODIUM PHOSPHATE 10 MG/ML IJ SOLN
10.0000 mg | Freq: Once | INTRAMUSCULAR | Status: AC
  Administered 2021-09-30: 10 mg via INTRAMUSCULAR

## 2021-09-30 MED ORDER — RIZATRIPTAN BENZOATE 5 MG PO TABS: 10.0000 mg | ORAL_TABLET | Freq: Every day | ORAL | 0 refills | Status: DC | PRN

## 2021-09-30 MED ORDER — ONDANSETRON 8 MG PO TBDP
8.0000 mg | ORAL_TABLET | Freq: Once | ORAL | Status: AC
  Administered 2021-09-30: 8 mg via ORAL

## 2021-09-30 MED ORDER — KETOROLAC TROMETHAMINE 60 MG/2ML IM SOLN
60.0000 mg | Freq: Once | INTRAMUSCULAR | Status: AC
  Administered 2021-09-30: 60 mg via INTRAMUSCULAR
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
## 2021-10-02 DIAGNOSIS — M542 Cervicalgia: Secondary | ICD-10-CM | POA: Diagnosis not present

## 2021-10-02 DIAGNOSIS — M5451 Vertebrogenic low back pain: Secondary | ICD-10-CM | POA: Diagnosis not present

## 2021-10-04 ENCOUNTER — Encounter: Payer: Self-pay | Admitting: Neurology

## 2021-10-06 ENCOUNTER — Ambulatory Visit
Admission: EM | Admit: 2021-10-06 | Discharge: 2021-10-06 | Disposition: A | Discharge status: Home / Self Care | Payer: BC Managed Care – PPO

## 2021-10-06 DIAGNOSIS — R519 Headache, unspecified: Secondary | ICD-10-CM

## 2021-10-06 DIAGNOSIS — G43909 Migraine, unspecified, not intractable, without status migrainosus: Secondary | ICD-10-CM | POA: Diagnosis not present

## 2021-10-06 DIAGNOSIS — R110 Nausea: Secondary | ICD-10-CM | POA: Diagnosis not present

## 2021-10-06 NOTE — ED Triage Notes (Signed)
Triaged by provider, patient in for recurrent migraine.

## 2021-10-06 NOTE — ED Provider Notes (Addendum)
Patient seen in triage by provider.  Patient has been seen here at urgent care 2 times within the last month for an acute non-intractable migrainous headache.  Most recent visit was less than 7 days ago.  Patient presents today with report of 3 days of persistent vomitus, photophobia and non-intractable headache nonresponsive to recently changed migraine medicine.  Patient also has some chronic cervical spine and lumbar spine disease that she has been treated for by neurosurgeon.  Treatment she received during her last UC visit was a headache cocktail and a prescription Maxalt which she reports did nothing for her symptoms.  I did advise given the complexity and the non-intractable nature of her headache she warrants ER evaluation and work-up.  She is accompanied by her husband who proceeded to become agitated and verbalized obscenities.  He was advised to discontinue that behavior he would not be permitted to return to UC.  He did not make any further verbal obscenities and left the facility without issue.  I did advise him prior to leaving that the visit would be a no charge as he was concerned that we were attempting to take his money without rendering service.  Visit has been coded as a no charge.  Patient was advised to go immediately to the ER for further work-up and evaluation.   Scot Jun, FNP 10/06/21 [place], [place], [place] 10/06/21 1124

## 2021-10-06 NOTE — ED Notes (Signed)
Patient is being discharged from the Urgent Care and sent to the Emergency Department via [place]. Per Molli Barrows FNP, patient is in need of higher level of care due to Need for further evaluation . Patient is aware and verbalizes understanding of plan of care.  Vitals:   10/06/21 1057  BP: (!) 134/91  Pulse: (!) 107  Resp: 20  Temp: (!) 97.5 F (36.4 C)  SpO2: 100%

## 2021-10-12 DIAGNOSIS — M542 Cervicalgia: Secondary | ICD-10-CM | POA: Diagnosis not present

## 2021-10-12 DIAGNOSIS — M5451 Vertebrogenic low back pain: Secondary | ICD-10-CM | POA: Diagnosis not present

## 2021-10-16 DIAGNOSIS — M5451 Vertebrogenic low back pain: Secondary | ICD-10-CM | POA: Diagnosis not present

## 2021-10-16 DIAGNOSIS — M542 Cervicalgia: Secondary | ICD-10-CM | POA: Diagnosis not present

## 2021-10-18 DIAGNOSIS — M542 Cervicalgia: Secondary | ICD-10-CM | POA: Diagnosis not present

## 2021-10-23 DIAGNOSIS — M129 Arthropathy, unspecified: Secondary | ICD-10-CM | POA: Diagnosis not present

## 2021-10-23 DIAGNOSIS — G43709 Chronic migraine without aura, not intractable, without status migrainosus: Secondary | ICD-10-CM | POA: Diagnosis not present

## 2021-10-23 DIAGNOSIS — R030 Elevated blood-pressure reading, without diagnosis of hypertension: Secondary | ICD-10-CM | POA: Diagnosis not present

## 2021-10-23 DIAGNOSIS — Z6830 Body mass index (BMI) 30.0-30.9, adult: Secondary | ICD-10-CM | POA: Diagnosis not present

## 2021-10-23 DIAGNOSIS — M5416 Radiculopathy, lumbar region: Secondary | ICD-10-CM | POA: Diagnosis not present

## 2021-10-23 DIAGNOSIS — Z79899 Other long term (current) drug therapy: Secondary | ICD-10-CM | POA: Diagnosis not present

## 2021-10-23 DIAGNOSIS — M542 Cervicalgia: Secondary | ICD-10-CM | POA: Diagnosis not present

## 2021-10-23 DIAGNOSIS — G8929 Other chronic pain: Secondary | ICD-10-CM | POA: Diagnosis not present

## 2021-10-25 DIAGNOSIS — Z79899 Other long term (current) drug therapy: Secondary | ICD-10-CM | POA: Diagnosis not present

## 2021-10-27 DIAGNOSIS — Z79899 Other long term (current) drug therapy: Secondary | ICD-10-CM | POA: Diagnosis not present

## 2021-10-27 DIAGNOSIS — I10 Essential (primary) hypertension: Secondary | ICD-10-CM | POA: Diagnosis not present

## 2021-10-27 DIAGNOSIS — Z0000 Encounter for general adult medical examination without abnormal findings: Secondary | ICD-10-CM | POA: Diagnosis not present

## 2021-10-27 DIAGNOSIS — E559 Vitamin D deficiency, unspecified: Secondary | ICD-10-CM | POA: Diagnosis not present

## 2021-10-30 DIAGNOSIS — H2013 Chronic iridocyclitis, bilateral: Secondary | ICD-10-CM | POA: Diagnosis not present

## 2021-10-30 DIAGNOSIS — Z6829 Body mass index (BMI) 29.0-29.9, adult: Secondary | ICD-10-CM | POA: Diagnosis not present

## 2021-10-30 DIAGNOSIS — M5416 Radiculopathy, lumbar region: Secondary | ICD-10-CM | POA: Diagnosis not present

## 2021-10-30 DIAGNOSIS — M797 Fibromyalgia: Secondary | ICD-10-CM | POA: Diagnosis not present

## 2021-10-30 DIAGNOSIS — Z79899 Other long term (current) drug therapy: Secondary | ICD-10-CM | POA: Diagnosis not present

## 2021-10-30 DIAGNOSIS — R030 Elevated blood-pressure reading, without diagnosis of hypertension: Secondary | ICD-10-CM | POA: Diagnosis not present

## 2021-11-02 DIAGNOSIS — R002 Palpitations: Secondary | ICD-10-CM | POA: Diagnosis not present

## 2021-11-02 DIAGNOSIS — I493 Ventricular premature depolarization: Secondary | ICD-10-CM | POA: Diagnosis not present

## 2021-11-03 DIAGNOSIS — Z79899 Other long term (current) drug therapy: Secondary | ICD-10-CM | POA: Diagnosis not present

## 2021-11-13 DIAGNOSIS — I493 Ventricular premature depolarization: Secondary | ICD-10-CM | POA: Diagnosis not present

## 2021-11-13 DIAGNOSIS — R002 Palpitations: Secondary | ICD-10-CM | POA: Diagnosis not present

## 2021-11-13 DIAGNOSIS — R42 Dizziness and giddiness: Secondary | ICD-10-CM | POA: Diagnosis not present

## 2021-11-14 DIAGNOSIS — M5412 Radiculopathy, cervical region: Secondary | ICD-10-CM | POA: Diagnosis not present

## 2021-11-28 DIAGNOSIS — R609 Edema, unspecified: Secondary | ICD-10-CM | POA: Diagnosis not present

## 2021-11-30 DIAGNOSIS — R002 Palpitations: Secondary | ICD-10-CM | POA: Diagnosis not present

## 2021-11-30 DIAGNOSIS — I493 Ventricular premature depolarization: Secondary | ICD-10-CM | POA: Diagnosis not present

## 2021-11-30 DIAGNOSIS — R42 Dizziness and giddiness: Secondary | ICD-10-CM | POA: Diagnosis not present

## 2021-12-18 DIAGNOSIS — Z1589 Genetic susceptibility to other disease: Secondary | ICD-10-CM | POA: Diagnosis not present

## 2021-12-18 DIAGNOSIS — M5440 Lumbago with sciatica, unspecified side: Secondary | ICD-10-CM | POA: Diagnosis not present

## 2021-12-18 DIAGNOSIS — M450 Ankylosing spondylitis of multiple sites in spine: Secondary | ICD-10-CM | POA: Diagnosis not present

## 2021-12-18 DIAGNOSIS — H2013 Chronic iridocyclitis, bilateral: Secondary | ICD-10-CM | POA: Diagnosis not present
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
## 2022-03-07 ENCOUNTER — Ambulatory Visit: Payer: BC Managed Care – PPO | Admitting: Neurology

## 2022-03-07 ENCOUNTER — Encounter: Payer: Self-pay | Admitting: Neurology

## 2022-03-07 DIAGNOSIS — Z029 Encounter for administrative examinations, unspecified: Secondary | ICD-10-CM

## 2022-04-24 DIAGNOSIS — R197 Diarrhea, unspecified: Secondary | ICD-10-CM | POA: Diagnosis not present

## 2022-04-24 DIAGNOSIS — M545 Low back pain, unspecified: Secondary | ICD-10-CM | POA: Diagnosis not present

## 2022-04-24 DIAGNOSIS — R112 Nausea with vomiting, unspecified: Secondary | ICD-10-CM | POA: Diagnosis not present

## 2022-04-24 DIAGNOSIS — R109 Unspecified abdominal pain: Secondary | ICD-10-CM | POA: Diagnosis not present

## 2022-04-30 DIAGNOSIS — R1031 Right lower quadrant pain: Secondary | ICD-10-CM | POA: Diagnosis not present

## 2022-04-30 DIAGNOSIS — R1110 Vomiting, unspecified: Secondary | ICD-10-CM | POA: Diagnosis not present

## 2022-04-30 DIAGNOSIS — R932 Abnormal findings on diagnostic imaging of liver and biliary tract: Secondary | ICD-10-CM | POA: Diagnosis not present

## 2022-04-30 DIAGNOSIS — R197 Diarrhea, unspecified: Secondary | ICD-10-CM | POA: Diagnosis not present

## 2022-05-02 ENCOUNTER — Other Ambulatory Visit: Payer: Self-pay | Admitting: Family Medicine

## 2022-05-02 DIAGNOSIS — K828 Other specified diseases of gallbladder: Secondary | ICD-10-CM

## 2022-05-08 DIAGNOSIS — K828 Other specified diseases of gallbladder: Secondary | ICD-10-CM | POA: Diagnosis not present

## 2022-05-08 DIAGNOSIS — R41840 Attention and concentration deficit: Secondary | ICD-10-CM | POA: Diagnosis not present

## 2022-05-08 DIAGNOSIS — R110 Nausea: Secondary | ICD-10-CM | POA: Diagnosis not present

## 2022-05-08 DIAGNOSIS — G43709 Chronic migraine without aura, not intractable, without status migrainosus: Secondary | ICD-10-CM | POA: Diagnosis not present

## 2022-05-10 ENCOUNTER — Encounter: Payer: Self-pay | Admitting: Internal Medicine

## 2022-05-10 ENCOUNTER — Ambulatory Visit: Payer: BC Managed Care – PPO | Admitting: Internal Medicine

## 2022-05-10 VITALS — BP 111/78 | HR 66 | Ht 66.0 in | Wt 173.0 lb

## 2022-05-10 DIAGNOSIS — I471 Supraventricular tachycardia, unspecified: Secondary | ICD-10-CM

## 2022-05-10 DIAGNOSIS — M450 Ankylosing spondylitis of multiple sites in spine: Secondary | ICD-10-CM | POA: Diagnosis not present

## 2022-05-10 DIAGNOSIS — R002 Palpitations: Secondary | ICD-10-CM

## 2022-05-10 DIAGNOSIS — R9431 Abnormal electrocardiogram [ECG] [EKG]: Secondary | ICD-10-CM | POA: Diagnosis not present
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
## 2022-05-14 ENCOUNTER — Ambulatory Visit: Payer: BC Managed Care – PPO

## 2022-05-14 ENCOUNTER — Other Ambulatory Visit: Payer: BC Managed Care – PPO

## 2022-05-14 DIAGNOSIS — R002 Palpitations: Secondary | ICD-10-CM

## 2022-05-14 DIAGNOSIS — R9431 Abnormal electrocardiogram [ECG] [EKG]: Secondary | ICD-10-CM | POA: Diagnosis not present

## 2022-05-14 DIAGNOSIS — M450 Ankylosing spondylitis of multiple sites in spine: Secondary | ICD-10-CM

## 2022-05-14 DIAGNOSIS — I471 Supraventricular tachycardia, unspecified: Secondary | ICD-10-CM

## 2022-05-15 NOTE — Progress Notes (Signed)
normal

## 2022-05-15 NOTE — Progress Notes (Signed)
Called patient, NA left echocardiogram results on VM.

## 2022-05-16 ENCOUNTER — Other Ambulatory Visit: Payer: BC Managed Care – PPO

## 2022-05-16 DIAGNOSIS — R9431 Abnormal electrocardiogram [ECG] [EKG]: Secondary | ICD-10-CM

## 2022-05-16 DIAGNOSIS — M450 Ankylosing spondylitis of multiple sites in spine: Secondary | ICD-10-CM

## 2022-05-16 DIAGNOSIS — I471 Supraventricular tachycardia, unspecified: Secondary | ICD-10-CM

## 2022-05-16 DIAGNOSIS — R002 Palpitations: Secondary | ICD-10-CM

## 2022-05-17 ENCOUNTER — Ambulatory Visit
Admission: EM | Admit: 2022-05-17 | Discharge: 2022-05-17 | Disposition: A | Discharge status: Home / Self Care | Payer: BC Managed Care – PPO | Attending: Urgent Care | Admitting: Urgent Care

## 2022-05-17 DIAGNOSIS — K8050 Calculus of bile duct without cholangitis or cholecystitis without obstruction: Secondary | ICD-10-CM

## 2022-05-17 LAB — POCT URINALYSIS DIP (MANUAL ENTRY)
Bilirubin, UA: NEGATIVE
Blood, UA: NEGATIVE
Glucose, UA: NEGATIVE mg/dL
Ketones, POC UA: NEGATIVE mg/dL
Leukocytes, UA: NEGATIVE
Nitrite, UA: NEGATIVE
Protein Ur, POC: NEGATIVE mg/dL
Spec Grav, UA: 1.025 (ref 1.010–1.025)
Urobilinogen, UA: 0.2 E.U./dL
pH, UA: 6.5 (ref 5.0–8.0)

## 2022-05-17 MED ORDER — KETOROLAC TROMETHAMINE 60 MG/2ML IM SOLN
60.0000 mg | Freq: Once | INTRAMUSCULAR | Status: AC
  Administered 2022-05-17: 60 mg via INTRAMUSCULAR

## 2022-05-17 MED ORDER — NAPROXEN 500 MG PO TABS: 500.0000 mg | ORAL_TABLET | Freq: Two times a day (BID) | ORAL | 0 refills | Status: DC
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
## 2022-05-17 NOTE — ED Triage Notes (Addendum)
Pt c/o right flank pain x 2 months-had recent GB work up and had been referred gen surg-had increase in pain x 24 hours-grimacing-slow gait with own cane

## 2022-05-22 ENCOUNTER — Other Ambulatory Visit: Payer: Self-pay

## 2022-05-22 ENCOUNTER — Encounter (HOSPITAL_BASED_OUTPATIENT_CLINIC_OR_DEPARTMENT_OTHER): Payer: Self-pay | Admitting: Emergency Medicine

## 2022-05-22 ENCOUNTER — Emergency Department (HOSPITAL_BASED_OUTPATIENT_CLINIC_OR_DEPARTMENT_OTHER): Payer: BC Managed Care – PPO

## 2022-05-22 ENCOUNTER — Inpatient Hospital Stay (HOSPITAL_BASED_OUTPATIENT_CLINIC_OR_DEPARTMENT_OTHER)
Admission: EM | Admit: 2022-05-22 | Discharge: 2022-05-26 | DRG: 392 | Disposition: A | Discharge status: Home / Self Care | Payer: BC Managed Care – PPO | Attending: Internal Medicine | Admitting: Internal Medicine

## 2022-05-22 DIAGNOSIS — K319 Disease of stomach and duodenum, unspecified: Secondary | ICD-10-CM | POA: Diagnosis not present

## 2022-05-22 DIAGNOSIS — D128 Benign neoplasm of rectum: Secondary | ICD-10-CM | POA: Diagnosis not present

## 2022-05-22 DIAGNOSIS — R112 Nausea with vomiting, unspecified: Secondary | ICD-10-CM | POA: Diagnosis not present

## 2022-05-22 DIAGNOSIS — Z90722 Acquired absence of ovaries, bilateral: Secondary | ICD-10-CM

## 2022-05-22 DIAGNOSIS — I471 Supraventricular tachycardia, unspecified: Secondary | ICD-10-CM | POA: Diagnosis present

## 2022-05-22 DIAGNOSIS — I493 Ventricular premature depolarization: Secondary | ICD-10-CM | POA: Diagnosis not present

## 2022-05-22 DIAGNOSIS — R1084 Generalized abdominal pain: Secondary | ICD-10-CM | POA: Diagnosis not present

## 2022-05-22 DIAGNOSIS — Z79899 Other long term (current) drug therapy: Secondary | ICD-10-CM

## 2022-05-22 DIAGNOSIS — M545 Low back pain, unspecified: Secondary | ICD-10-CM | POA: Diagnosis present

## 2022-05-22 DIAGNOSIS — Z87891 Personal history of nicotine dependence: Secondary | ICD-10-CM

## 2022-05-22 DIAGNOSIS — K50011 Crohn's disease of small intestine with rectal bleeding: Secondary | ICD-10-CM | POA: Diagnosis not present

## 2022-05-22 DIAGNOSIS — I700 Atherosclerosis of aorta: Secondary | ICD-10-CM | POA: Diagnosis not present

## 2022-05-22 DIAGNOSIS — F319 Bipolar disorder, unspecified: Secondary | ICD-10-CM | POA: Diagnosis not present

## 2022-05-22 DIAGNOSIS — K2950 Unspecified chronic gastritis without bleeding: Secondary | ICD-10-CM | POA: Diagnosis not present

## 2022-05-22 DIAGNOSIS — M797 Fibromyalgia: Secondary | ICD-10-CM

## 2022-05-22 DIAGNOSIS — I119 Hypertensive heart disease without heart failure: Secondary | ICD-10-CM | POA: Diagnosis present

## 2022-05-22 DIAGNOSIS — D125 Benign neoplasm of sigmoid colon: Secondary | ICD-10-CM | POA: Diagnosis not present

## 2022-05-22 DIAGNOSIS — A0811 Acute gastroenteropathy due to Norwalk agent: Principal | ICD-10-CM | POA: Insufficient documentation

## 2022-05-22 DIAGNOSIS — Z9079 Acquired absence of other genital organ(s): Secondary | ICD-10-CM

## 2022-05-22 DIAGNOSIS — K5000 Crohn's disease of small intestine without complications: Secondary | ICD-10-CM

## 2022-05-22 DIAGNOSIS — R001 Bradycardia, unspecified: Secondary | ICD-10-CM | POA: Diagnosis not present

## 2022-05-22 DIAGNOSIS — K922 Gastrointestinal hemorrhage, unspecified: Secondary | ICD-10-CM

## 2022-05-22 DIAGNOSIS — K2970 Gastritis, unspecified, without bleeding: Secondary | ICD-10-CM | POA: Diagnosis not present

## 2022-05-22 DIAGNOSIS — E876 Hypokalemia: Secondary | ICD-10-CM | POA: Insufficient documentation

## 2022-05-22 DIAGNOSIS — M459 Ankylosing spondylitis of unspecified sites in spine: Secondary | ICD-10-CM | POA: Diagnosis not present

## 2022-05-22 DIAGNOSIS — G8929 Other chronic pain: Secondary | ICD-10-CM | POA: Diagnosis not present

## 2022-05-22 DIAGNOSIS — I10 Essential (primary) hypertension: Secondary | ICD-10-CM | POA: Diagnosis not present

## 2022-05-22 DIAGNOSIS — R1013 Epigastric pain: Secondary | ICD-10-CM | POA: Diagnosis not present

## 2022-05-22 DIAGNOSIS — M450 Ankylosing spondylitis of multiple sites in spine: Secondary | ICD-10-CM

## 2022-05-22 DIAGNOSIS — F419 Anxiety disorder, unspecified: Secondary | ICD-10-CM | POA: Diagnosis present

## 2022-05-22 DIAGNOSIS — K50019 Crohn's disease of small intestine with unspecified complications: Secondary | ICD-10-CM

## 2022-05-22 DIAGNOSIS — K621 Rectal polyp: Secondary | ICD-10-CM | POA: Diagnosis present

## 2022-05-22 DIAGNOSIS — K921 Melena: Secondary | ICD-10-CM | POA: Diagnosis not present

## 2022-05-22 DIAGNOSIS — R933 Abnormal findings on diagnostic imaging of other parts of digestive tract: Secondary | ICD-10-CM | POA: Diagnosis not present

## 2022-05-22 DIAGNOSIS — K529 Noninfective gastroenteritis and colitis, unspecified: Secondary | ICD-10-CM

## 2022-05-22 DIAGNOSIS — K635 Polyp of colon: Secondary | ICD-10-CM | POA: Insufficient documentation

## 2022-05-22 LAB — CBC WITH DIFFERENTIAL/PLATELET
Abs Immature Granulocytes: 0.02 10*3/uL (ref 0.00–0.07)
Basophils Absolute: 0 10*3/uL (ref 0.0–0.1)
Basophils Relative: 0 %
Eosinophils Absolute: 0.2 10*3/uL (ref 0.0–0.5)
Eosinophils Relative: 2 %
HCT: 43.1 % (ref 36.0–46.0)
Hemoglobin: 15.2 g/dL — ABNORMAL HIGH (ref 12.0–15.0)
Immature Granulocytes: 0 %
Lymphocytes Relative: 25 %
Lymphs Abs: 1.8 10*3/uL (ref 0.7–4.0)
MCH: 31.8 pg (ref 26.0–34.0)
MCHC: 35.3 g/dL (ref 30.0–36.0)
MCV: 90.2 fL (ref 80.0–100.0)
Monocytes Absolute: 0.5 10*3/uL (ref 0.1–1.0)
Monocytes Relative: 7 %
Neutro Abs: 4.7 10*3/uL (ref 1.7–7.7)
Neutrophils Relative %: 66 %
Platelets: 299 10*3/uL (ref 150–400)
RBC: 4.78 MIL/uL (ref 3.87–5.11)
RDW: 13.2 % (ref 11.5–15.5)
WBC: 7.1 10*3/uL (ref 4.0–10.5)
nRBC: 0 % (ref 0.0–0.2)

## 2022-05-22 LAB — COMPREHENSIVE METABOLIC PANEL
ALT: 15 U/L (ref 0–44)
AST: 18 U/L (ref 15–41)
Albumin: 5.1 g/dL — ABNORMAL HIGH (ref 3.5–5.0)
Alkaline Phosphatase: 69 U/L (ref 38–126)
Anion gap: 8 (ref 5–15)
BUN: 6 mg/dL (ref 6–20)
CO2: 24 mmol/L (ref 22–32)
Calcium: 9.6 mg/dL (ref 8.9–10.3)
Chloride: 105 mmol/L (ref 98–111)
Creatinine, Ser: 0.73 mg/dL (ref 0.44–1.00)
GFR, Estimated: >60 mL/min (ref >60)
Glucose, Bld: 97 mg/dL (ref 70–99)
Potassium: 3.9 mmol/L (ref 3.5–5.1)
Sodium: 137 mmol/L (ref 135–145)
Total Bilirubin: 1.2 mg/dL (ref 0.3–1.2)
Total Protein: 7.4 g/dL (ref 6.5–8.1)

## 2022-05-22 LAB — LIPASE, BLOOD: Lipase: 20 U/L (ref 11–51)

## 2022-05-22 LAB — OCCULT BLOOD X 1 CARD TO LAB, STOOL: Fecal Occult Bld: NEGATIVE

## 2022-05-22 MED ORDER — ACETAMINOPHEN 325 MG PO TABS
650.0000 mg | ORAL_TABLET | Freq: Four times a day (QID) | ORAL | Status: DC | PRN
  Administered 2022-05-25: 650 mg via ORAL
  Filled 2022-05-22: qty 2

## 2022-05-22 MED ORDER — SODIUM CHLORIDE 0.9 % IV BOLUS
1000.0000 mL | Freq: Once | INTRAVENOUS | Status: AC
  Administered 2022-05-22: 1000 mL via INTRAVENOUS

## 2022-05-22 MED ORDER — PANTOPRAZOLE SODIUM 40 MG IV SOLR
40.0000 mg | Freq: Once | INTRAVENOUS | Status: AC
  Administered 2022-05-22: 40 mg via INTRAVENOUS
  Filled 2022-05-22: qty 10

## 2022-05-22 MED ORDER — SENNOSIDES-DOCUSATE SODIUM 8.6-50 MG PO TABS: 1.0000 | ORAL_TABLET | Freq: Every evening | ORAL | Status: DC | PRN

## 2022-05-22 MED ORDER — BACLOFEN 10 MG PO TABS
10.0000 mg | ORAL_TABLET | Freq: Three times a day (TID) | ORAL | Status: DC | PRN
  Administered 2022-05-23 – 2022-05-25 (×4): 10 mg via ORAL
  Filled 2022-05-22 (×4): qty 1

## 2022-05-22 MED ORDER — FENTANYL CITRATE PF 50 MCG/ML IJ SOSY
50.0000 ug | PREFILLED_SYRINGE | Freq: Once | INTRAMUSCULAR | Status: AC
  Administered 2022-05-22: 50 ug via INTRAVENOUS
  Filled 2022-05-22: qty 1

## 2022-05-22 MED ORDER — GABAPENTIN 300 MG PO CAPS
300.0000 mg | ORAL_CAPSULE | Freq: Every day | ORAL | Status: DC
  Administered 2022-05-22 – 2022-05-26 (×5): 300 mg via ORAL
  Filled 2022-05-22 (×5): qty 1

## 2022-05-22 MED ORDER — IOHEXOL 350 MG/ML SOLN
100.0000 mL | Freq: Once | INTRAVENOUS | Status: AC | PRN
  Administered 2022-05-22: 85 mL via INTRAVENOUS

## 2022-05-22 MED ORDER — SODIUM CHLORIDE 0.9 % IV SOLN
3.0000 g | Freq: Four times a day (QID) | INTRAVENOUS | Status: DC
  Administered 2022-05-22 – 2022-05-24 (×7): 3 g via INTRAVENOUS
  Filled 2022-05-22 (×9): qty 8

## 2022-05-22 MED ORDER — ONDANSETRON HCL 4 MG/2ML IJ SOLN
4.0000 mg | Freq: Three times a day (TID) | INTRAMUSCULAR | Status: AC | PRN
  Administered 2022-05-22 – 2022-05-23 (×2): 4 mg via INTRAVENOUS
  Filled 2022-05-22 (×2): qty 2

## 2022-05-22 MED ORDER — MORPHINE SULFATE (PF) 2 MG/ML IV SOLN
0.5000 mg | INTRAVENOUS | Status: DC | PRN
  Administered 2022-05-22 – 2022-05-23 (×3): 0.5 mg via INTRAVENOUS
  Filled 2022-05-22 (×3): qty 1

## 2022-05-22 MED ORDER — METOPROLOL SUCCINATE ER 50 MG PO TB24: 100.0000 mg | ORAL_TABLET | Freq: Every day | ORAL | Status: DC

## 2022-05-22 MED ORDER — SODIUM CHLORIDE 0.9 % IV SOLN: INTRAVENOUS | Status: DC

## 2022-05-22 MED ORDER — FAMOTIDINE IN NACL 20-0.9 MG/50ML-% IV SOLN
20.0000 mg | Freq: Once | INTRAVENOUS | Status: AC
  Administered 2022-05-22: 20 mg via INTRAVENOUS
  Filled 2022-05-22: qty 50

## 2022-05-22 MED ORDER — PANTOPRAZOLE SODIUM 40 MG IV SOLR
40.0000 mg | INTRAVENOUS | Status: DC
  Administered 2022-05-23 – 2022-05-26 (×4): 40 mg via INTRAVENOUS
  Filled 2022-05-22 (×4): qty 10

## 2022-05-22 MED ORDER — SUMATRIPTAN SUCCINATE 50 MG PO TABS
50.0000 mg | ORAL_TABLET | ORAL | Status: AC | PRN
  Administered 2022-05-25 (×2): 50 mg via ORAL
  Filled 2022-05-22 (×3): qty 1

## 2022-05-22 MED ORDER — ACETAMINOPHEN 650 MG RE SUPP: 650.0000 mg | Freq: Four times a day (QID) | RECTAL | Status: DC | PRN
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
## 2022-05-22 NOTE — ED Notes (Signed)
Kayla Gross with [facility] called for transport

## 2022-05-22 NOTE — ED Notes (Signed)
Patient transported to CT 

## 2022-05-22 NOTE — ED Notes (Signed)
Patient transported to Ultrasound 

## 2022-05-22 NOTE — ED Provider Notes (Signed)
[facility] Provider Note   CSN: [id number] Arrival date & time: 05/22/22  1005     History  Chief Complaint  Patient presents with   Abdominal Pain    Kayla Gross is a 42 y.o. female.   Abdominal Pain    42 year old female presenting to the emergency department with multiple complaints.  The patient has a history of bipolar disorder, anxiety, depression, states that for the past month she has had intermittent nausea and vomiting, epigastric and right upper quadrant abdominal pain.  She was seen in urgent care 1 month ago and had an ultrasound which revealed sludge in her gallbladder.  For the past 2 days she has noted worsening burning pain in the epigastrium that radiates to the umbilicus and the back.  She denies any fevers, endorses chills.  She states that she has had roughly 14 pound weight loss over the past 6 months.  Additionally, she states that she has had intermittent diarrhea with blood mixed in over the past month as well.  She had an episode of bloody diarrhea this morning.   Home Medications Prior to Admission medications   Medication Sig Start Date End Date Taking? Authorizing Provider  amLODipine (NORVASC) 2.5 MG tablet Take 2.5 mg by mouth daily. 11/09/21   [provider]  baclofen (LIORESAL) 10 MG tablet Take 10 mg by mouth 3 (three) times daily.    [provider]  gabapentin (NEURONTIN) 300 MG capsule TAKE 1 CAPSULE BY MOUTH AT BEDTIME AS NEEDED (PAIN). Patient taking differently: Take 300 mg by mouth daily. 07/19/21   Mcarthur Rossetti, MD  hydrOXYzine (ATARAX) 25 MG tablet Take 25 mg by mouth 3 (three) times daily. 05/08/22   [provider]  metoprolol succinate (TOPROL-XL) 100 MG 24 hr tablet Take 100 mg by mouth daily. 07/07/20   [provider]  naproxen (NAPROSYN) 500 MG tablet Take 1 tablet (500 mg total) by mouth 2 (two) times daily with a meal. 05/17/22   Jaynee Eagles,  PA-C  SUMAtriptan (IMITREX) 50 MG tablet Take 1 tablet (50 mg total) by mouth every 2 (two) hours as needed for migraine. May repeat in 2 hours if headache persists or recurs. 09/10/21   Lynden Oxford Scales, PA-C      Allergies    Patient has no known allergies.    Review of Systems   Review of Systems  Gastrointestinal:  Positive for abdominal pain.    Physical Exam Updated Vital Signs BP 111/75   Pulse (!) 45   Temp 99 F (37.2 C) (Oral)   Resp 14   LMP 11/02/2020 (Approximate)   SpO2 100%  Physical Exam Vitals and nursing note reviewed.  Constitutional:      General: She is not in acute distress.    Appearance: She is well-developed.  HENT:     Head: Normocephalic and atraumatic.  Eyes:     Conjunctiva/sclera: Conjunctivae normal.  Cardiovascular:     Rate and Rhythm: Normal rate and regular rhythm.  Pulmonary:     Effort: Pulmonary effort is normal. No respiratory distress.     Breath sounds: Normal breath sounds.  Abdominal:     Palpations: Abdomen is soft.     Tenderness: There is abdominal tenderness in the right upper quadrant, epigastric area and left lower quadrant. There is guarding. There is no rebound.  Musculoskeletal:        General: No swelling.     Cervical back: Neck  supple.  Skin:    General: Skin is warm and dry.     Capillary Refill: Capillary refill takes less than 2 seconds.  Neurological:     Mental Status: She is alert.  Psychiatric:        Mood and Affect: Mood normal.     ED Results / Procedures / Treatments   Labs (all labs ordered are listed, but only abnormal results are displayed) Labs Reviewed  COMPREHENSIVE METABOLIC PANEL - Abnormal; Notable for the following components:      Result Value   Albumin 5.1 (*)    All other components within normal limits  CBC WITH DIFFERENTIAL/PLATELET - Abnormal; Notable for the following components:   Hemoglobin 15.2 (*)    All other components within normal limits  LIPASE, BLOOD  OCCULT  BLOOD X 1 CARD TO LAB, STOOL    EKG EKG Interpretation  Date/Time:  Tuesday May 22 2022 11:42:25 EST Ventricular Rate:  47 PR Interval:  147 QRS Duration: 118 QT Interval:  455 QTC Calculation: 403 R Axis:   7 Text Interpretation: Sinus bradycardia Probable left atrial enlargement Incomplete right bundle branch block Low voltage, extremity and precordial leads Confirmed by Regan Lemming ([id number]) on 05/22/2022 12:02:54 PM  Radiology CT ANGIO GI BLEED  Result Date: 05/22/2022 CLINICAL DATA:  Bloody diarrhea and hematochezia for 1 month with abdominal pain, nausea and vomiting. EXAM: CTA ABDOMEN AND PELVIS WITHOUT AND WITH CONTRAST TECHNIQUE: Multidetector CT imaging of the abdomen and pelvis was performed using the standard protocol before and during bolus administration of intravenous contrast. Multiplanar reconstructed images and MIPs were obtained and reviewed to evaluate the vascular anatomy. RADIATION DOSE REDUCTION: This exam was performed according to the departmental dose-optimization program which includes automated exposure control, adjustment of the mA and/or kV according to patient size and/or use of iterative reconstruction technique. CONTRAST:  6m OMNIPAQUE IOHEXOL 350 MG/ML SOLN COMPARISON:  04/18/2020 CT abdomen/pelvis. FINDINGS: Lower chest: No significant pulmonary nodules or acute consolidative airspace disease. Hepatobiliary: Normal liver size. No liver mass. Normal gallbladder with no radiopaque cholelithiasis. No biliary ductal dilatation. Pancreas: Normal, with no mass or duct dilation. Spleen: Normal size. No mass. Adrenals/Urinary Tract: Normal adrenals. No renal stones. No hydronephrosis. Normal bladder. Stomach/Bowel: Normal non-distended stomach. Normal caliber small bowel. Mild wall thickening throughout the terminal ileum with some associated submucosal fat, with mildly increased wall thickening from 04/18/2020 CT. No additional sites of small bowel wall thickening.  Normal appendix. There is a small focus of vague enhancement in the lumen of the distal sigmoid colon measuring 1.0 x 1.0 cm (series 6/image 149). No associated colonic wall thickening, diverticulosis or pericolonic fat stranding. Otherwise no potential sites of intraluminal hemorrhage in the bowel. Vascular/Lymphatic: Normal caliber abdominal aorta. Patent portal, splenic, hepatic and renal veins. No pathologically enlarged lymph nodes in the abdomen or pelvis. Reproductive: Status post hysterectomy, with no abnormal findings at the vaginal cuff. No adnexal mass. Other: No pneumoperitoneum, ascites or focal fluid collection. Chronic 2.0 x 2.0 cm left lower presacral perineural cystic lesion (series 11/image 60), stable from 04/18/2020 CT, presumably benign. Musculoskeletal: No aggressive appearing focal osseous lesions. Mild degenerative disc disease at L5-S1. IMPRESSION: 1. Small focus of vague enhancement in the lumen of the distal sigmoid colon measuring 1.0 cm, without associated colonic wall thickening, diverticulosis or pericolonic fat stranding. This finding is nonspecific and could represent a small focus of active intraluminal hemorrhage in the setting of lower GI bleeding versus a small enhancing colonic lesion.  Suggest GI consultation for consideration of colonoscopy correlation. 2. Mild wall thickening throughout the terminal ileum with some associated submucosal fat, with mildly increased wall thickening from 04/18/2020 CT. Findings are compatible with a nonspecific chronic infectious or inflammatory terminal ileitis, with the differential including Crohn disease. No bowel obstruction. Electronically Signed   By: Ilona Sorrel M.D.   On: 05/22/2022 11:56   US Abdomen Limited RUQ (LIVER/GB)  Result Date: 05/22/2022 CLINICAL DATA:  Epigastric pain for 2 days EXAM: ULTRASOUND ABDOMEN LIMITED RIGHT UPPER QUADRANT COMPARISON:  04/18/2020 CT abdomen/pelvis FINDINGS: Gallbladder: No gallstones or wall  thickening visualized. No sonographic Murphy sign noted by sonographer. Common bile duct: Diameter: 2 mm Liver: No focal lesion identified. Within normal limits in parenchymal echogenicity. Portal vein is patent on color Doppler imaging with normal direction of blood flow towards the liver. Other: None. IMPRESSION: Normal right upper quadrant ultrasound.  No cholelithiasis. Electronically Signed   By: Ilona Sorrel M.D.   On: 05/22/2022 11:15    Procedures Procedures    Medications Ordered in ED Medications  sodium chloride 0.9 % bolus 1,000 mL (0 mLs Intravenous Stopped 05/22/22 1235)    And  0.9 %  sodium chloride infusion (has no administration in time range)  ondansetron (ZOFRAN) injection 4 mg (has no administration in time range)  famotidine (PEPCID) IVPB 20 mg premix (0 mg Intravenous Stopped 05/22/22 1204)  fentaNYL (SUBLIMAZE) injection 50 mcg (50 mcg Intravenous Given 05/22/22 1129)  pantoprazole (PROTONIX) injection 40 mg (40 mg Intravenous Given 05/22/22 1131)  iohexol (OMNIPAQUE) 350 MG/ML injection 100 mL (85 mLs Intravenous Contrast Given 05/22/22 1116)    ED Course/ Medical Decision Making/ A&P                             Medical Decision Making Amount and/or Complexity of Data Reviewed Labs: ordered. Radiology: ordered.  Risk Prescription drug management. Decision regarding hospitalization.    42 year old female presenting to the emergency department with multiple complaints.  The patient has a history of bipolar disorder, anxiety, depression, states that for the past month she has had intermittent nausea and vomiting, epigastric and right upper quadrant abdominal pain.  She was seen in urgent care 1 month ago and had an ultrasound which revealed sludge in her gallbladder.  For the past 2 days she has noted worsening burning pain in the epigastrium that radiates to the umbilicus and the back.  She denies any fevers, endorses chills.  She states that she has had roughly 14  pound weight loss over the past 6 months.  Additionally, she states that she has had intermittent diarrhea with blood mixed in over the past month as well.  She had an episode of bloody diarrhea this morning.   On arrival, the patient was afebrile, temperature 99, hemodynamically stable, heart rate 69, BP 131/88, RR 17, saturating 100% on room air.  Physical exam significant for right upper quadrant, epigastric and left lower quadrant tenderness to palpation, mild guarding no rebound tenderness.  Differential diagnosis includes infectious colitis, inflammatory bowel disease, less likely mesenteric ischemia, considered diverticulitis, lower GI bleed, colonic mass and subsequent bleeding or other malignancy intra-abdominally.  Considered pancreatitis, cholelithiasis/cholecystitis, GERD, peptic ulcer disease. The patient is status post hysterectomy.  Laboratory evaluation significant for fecal occult negative, CBC without a leukocytosis, hemoglobin actually with evidence of hemoconcentration with a hemoglobin of 15.2, lipase normal at 20, CMP generally unremarkable, normal LFTs, normal biliary function.  IV access was obtained and the patient was administered IV Pepcid, IV Protonix, IV fentanyl for pain control and an IV fluid bolus.  A right upper quadrant ultrasound was obtained initially and resulted negative for acute abnormalities.  CTA GI Bleed Protocol: IMPRESSION:  1. Small focus of vague enhancement in the lumen of the distal  sigmoid colon measuring 1.0 cm, without associated colonic wall  thickening, diverticulosis or pericolonic fat stranding. This  finding is nonspecific and could represent a small focus of active  intraluminal hemorrhage in the setting of lower GI bleeding versus a  small enhancing colonic lesion. Suggest GI consultation for  consideration of colonoscopy correlation.  2. Mild wall thickening throughout the terminal ileum with some  associated submucosal fat, with  mildly increased wall thickening  from 04/18/2020 CT. Findings are compatible with a nonspecific  chronic infectious or inflammatory terminal ileitis, with the  differential including Crohn disease. No bowel obstruction.    Due to these findings on CT angiogram, gastroenterology was consulted for further recommendations.   [place] with Dr Paulita Fujita of [facility] gastroenterology who recommended the patient be admitted for further management, recommended hospitalist admission, gastroenterology will see in consultation.  Dr. Olevia Bowens was consulted for admission and ultimately accepted the patient in admission for further management.   Final Clinical Impression(s) / ED Diagnoses Final diagnoses:  Ileitis  Gastrointestinal hemorrhage, unspecified gastrointestinal hemorrhage type  Generalized abdominal pain    Rx / DC Orders ED Discharge Orders     None         Regan Lemming, MD 05/22/22 1315

## 2022-05-22 NOTE — ED Triage Notes (Signed)
Pt reports abdominal pain with n/v. Pt reports 1 month ago she had sludge in her gallbladder. The past 2 days pt has bad burning pain in the epigastrium area that radiates to the umbilicus. Denies fevers.

## 2022-05-22 NOTE — Progress Notes (Signed)
Plan of Care Note for accepted transfer   Patient: Kayla Gross MRN: [id number]   DOA: 05/22/2022  Facility requesting transfer: [facility].  Requesting Provider: Regan Lemming, MD Reason for transfer: Abdominal pain/Crohn's disease. Facility course:  Per EDP: "Abdominal Pain    42 year old female presenting to the emergency department with multiple complaints.  The patient has a history of bipolar disorder, anxiety, depression, states that for the past month she has had intermittent nausea and vomiting, epigastric and right upper quadrant abdominal pain.  She was seen in urgent care 1 month ago and had an ultrasound which revealed sludge in her gallbladder.  For the past 2 days she has noted worsening burning pain in the epigastrium that radiates to the umbilicus and the back.  She denies any fevers, endorses chills.  She states that she has had roughly 14 pound weight loss over the past 6 months.  Additionally, she states that she has had intermittent diarrhea with blood mixed in over the past month as well.  She had an episode of bloody diarrhea this morning."  Lab work: Occult blood card to lab, stool [id number]   Collected: 05/22/22 1106   Updated: 05/22/22 1118   Specimen Type: Stool    Fecal Occult Bld NEGATIVE  Comprehensive metabolic panel [id number] (Abnormal)   Collected: 05/22/22 1026   Updated: 05/22/22 1104   Specimen Type: Blood    Sodium 137 mmol/L   Potassium 3.9 mmol/L   Chloride 105 mmol/L   CO2 24 mmol/L   Glucose, Bld 97 mg/dL   BUN 6 mg/dL   Creatinine, Ser 0.73 mg/dL   Calcium 9.6 mg/dL   Total Protein 7.4 g/dL   Albumin 5.1 High  g/dL   AST 18 U/L   ALT 15 U/L   Alkaline Phosphatase 69 U/L   Total Bilirubin 1.2 mg/dL   GFR, Estimated >60 mL/min   Anion gap 8  Lipase, blood [id number]   Collected: 05/22/22 1026   Updated: 05/22/22 1104   Specimen Type: Blood    Lipase 20 U/L  CBC with Diff [id number] (Abnormal)   Collected: 05/22/22 1026   Updated:  05/22/22 1039   Specimen Type: Blood    WBC 7.1 K/uL   RBC 4.78 MIL/uL   Hemoglobin 15.2 High  g/dL   HCT 43.1 %   MCV 90.2 fL   MCH 31.8 pg   MCHC 35.3 g/dL   RDW 13.2 %   Platelets 299 K/uL   nRBC 0.0 %   Neutrophils Relative % 66 %   Neutro Abs 4.7 K/uL   Lymphocytes Relative 25 %   Lymphs Abs 1.8 K/uL   Monocytes Relative 7 %   Monocytes Absolute 0.5 K/uL   Eosinophils Relative 2 %   Eosinophils Absolute 0.2 K/uL   Basophils Relative 0 %   Basophils Absolute 0.0 K/uL   Immature Granulocytes 0 %   Abs Immature Granulocytes 0.02 K/uL   Imaging: RUQ ultrasound was normal.  CTA GI bleed: IMPRESSION: 1. Small focus of vague enhancement in the lumen of the distal sigmoid colon measuring 1.0 cm, without associated colonic wall thickening, diverticulosis or pericolonic fat stranding. This finding is nonspecific and could represent a small focus of active intraluminal hemorrhage in the setting of lower GI bleeding versus a small enhancing colonic lesion. Suggest GI consultation for consideration of colonoscopy correlation. 2. Mild wall thickening throughout the terminal ileum with some associated submucosal fat, with mildly increased wall thickening from 04/18/2020 CT. Findings are compatible with a  nonspecific chronic infectious or inflammatory terminal ileitis, with the differential including Crohn disease. No bowel obstruction.   Electronically Signed   By: Ilona Sorrel M.D.   On: 05/22/2022 11:56   Plan of care: The patient is accepted for admission to [facility]  unit, at [facility].  Please call Dr. Pamalee Leyden GI when the patient arrives to the hospital.  Author: Reubin Milan, MD 05/22/2022  Check [email or web address] for on-call coverage.  Nursing staff, Please call [facility] number on Amion as soon as patient's arrival, so appropriate admitting provider can evaluate the pt.

## 2022-05-22 NOTE — Progress Notes (Signed)
Pharmacy Antibiotic Note  Kayla Gross is a 42 y.o. female admitted on 05/22/2022 with intraabdominal.  Pharmacy has been consulted for Unasyn dosing.  Plan: Unasyn 3g IV q6h No dose adjustments anticipated.  Pharmacy will sign off and monitor peripherally via electronic surveillance software for any changes in renal function or micro data.     Temp (24hrs), Avg:98.2 F (36.8 C), Min:97.5 F (36.4 C), Max:99 F (37.2 C)  Recent Labs  Lab 05/22/22 1026  WBC 7.1  CREATININE 0.73    Estimated Creatinine Clearance: 96.9 mL/min (by C-G formula based on SCr of 0.73 mg/dL).    No Known Allergies  Antimicrobials this admission: 2/13 Unasyn >>  Dose adjustments this admission: none  Microbiology results: 2/13 GI panel:  Thank you for allowing pharmacy to be a part of this patient's care.  Peggyann Juba, PharmD, BCPS Pharmacy: [phone] 05/22/2022 6:00 PM

## 2022-05-23 ENCOUNTER — Other Ambulatory Visit: Payer: BC Managed Care – PPO

## 2022-05-23 DIAGNOSIS — M797 Fibromyalgia: Secondary | ICD-10-CM | POA: Diagnosis present

## 2022-05-23 DIAGNOSIS — I119 Hypertensive heart disease without heart failure: Secondary | ICD-10-CM | POA: Diagnosis present

## 2022-05-23 DIAGNOSIS — Z87891 Personal history of nicotine dependence: Secondary | ICD-10-CM | POA: Diagnosis not present

## 2022-05-23 DIAGNOSIS — K50011 Crohn's disease of small intestine with rectal bleeding: Secondary | ICD-10-CM

## 2022-05-23 DIAGNOSIS — K5000 Crohn's disease of small intestine without complications: Secondary | ICD-10-CM | POA: Diagnosis present

## 2022-05-23 DIAGNOSIS — A0811 Acute gastroenteropathy due to Norwalk agent: Secondary | ICD-10-CM | POA: Diagnosis present

## 2022-05-23 DIAGNOSIS — G8929 Other chronic pain: Secondary | ICD-10-CM | POA: Diagnosis present

## 2022-05-23 DIAGNOSIS — I471 Supraventricular tachycardia, unspecified: Secondary | ICD-10-CM | POA: Diagnosis not present

## 2022-05-23 DIAGNOSIS — I493 Ventricular premature depolarization: Secondary | ICD-10-CM | POA: Diagnosis present

## 2022-05-23 DIAGNOSIS — M450 Ankylosing spondylitis of multiple sites in spine: Secondary | ICD-10-CM | POA: Diagnosis not present

## 2022-05-23 DIAGNOSIS — Z79899 Other long term (current) drug therapy: Secondary | ICD-10-CM | POA: Diagnosis not present

## 2022-05-23 DIAGNOSIS — E876 Hypokalemia: Secondary | ICD-10-CM | POA: Diagnosis present

## 2022-05-23 DIAGNOSIS — F319 Bipolar disorder, unspecified: Secondary | ICD-10-CM | POA: Diagnosis present

## 2022-05-23 DIAGNOSIS — M545 Low back pain, unspecified: Secondary | ICD-10-CM | POA: Diagnosis present

## 2022-05-23 DIAGNOSIS — K529 Noninfective gastroenteritis and colitis, unspecified: Secondary | ICD-10-CM | POA: Diagnosis present

## 2022-05-23 DIAGNOSIS — M459 Ankylosing spondylitis of unspecified sites in spine: Secondary | ICD-10-CM | POA: Diagnosis present

## 2022-05-23 DIAGNOSIS — K2970 Gastritis, unspecified, without bleeding: Secondary | ICD-10-CM | POA: Diagnosis present

## 2022-05-23 DIAGNOSIS — K621 Rectal polyp: Secondary | ICD-10-CM | POA: Diagnosis present

## 2022-05-23 DIAGNOSIS — Z9079 Acquired absence of other genital organ(s): Secondary | ICD-10-CM | POA: Diagnosis not present

## 2022-05-23 DIAGNOSIS — F419 Anxiety disorder, unspecified: Secondary | ICD-10-CM | POA: Diagnosis present

## 2022-05-23 DIAGNOSIS — Z90722 Acquired absence of ovaries, bilateral: Secondary | ICD-10-CM | POA: Diagnosis not present

## 2022-05-23 LAB — COMPREHENSIVE METABOLIC PANEL
ALT: 14 U/L (ref 0–44)
AST: 14 U/L — ABNORMAL LOW (ref 15–41)
Albumin: 3.3 g/dL — ABNORMAL LOW (ref 3.5–5.0)
Alkaline Phosphatase: 45 U/L (ref 38–126)
Anion gap: 7 (ref 5–15)
BUN: <5 mg/dL — ABNORMAL LOW (ref 6–20)
CO2: 22 mmol/L (ref 22–32)
Calcium: 8.2 mg/dL — ABNORMAL LOW (ref 8.9–10.3)
Chloride: 106 mmol/L (ref 98–111)
Creatinine, Ser: 0.67 mg/dL (ref 0.44–1.00)
GFR, Estimated: >60 mL/min (ref >60)
Glucose, Bld: 83 mg/dL (ref 70–99)
Potassium: 4 mmol/L (ref 3.5–5.1)
Sodium: 135 mmol/L (ref 135–145)
Total Bilirubin: 1.1 mg/dL (ref 0.3–1.2)
Total Protein: 5.5 g/dL — ABNORMAL LOW (ref 6.5–8.1)

## 2022-05-23 LAB — CBC
HCT: 36.8 % (ref 36.0–46.0)
Hemoglobin: 12.3 g/dL (ref 12.0–15.0)
MCH: 31.7 pg (ref 26.0–34.0)
MCHC: 33.4 g/dL (ref 30.0–36.0)
MCV: 94.8 fL (ref 80.0–100.0)
Platelets: 217 10*3/uL (ref 150–400)
RBC: 3.88 MIL/uL (ref 3.87–5.11)
RDW: 13.4 % (ref 11.5–15.5)
WBC: 5.4 10*3/uL (ref 4.0–10.5)
nRBC: 0 % (ref 0.0–0.2)

## 2022-05-23 LAB — HIV ANTIBODY (ROUTINE TESTING W REFLEX): HIV Screen 4th Generation wRfx: NONREACTIVE

## 2022-05-23 LAB — C-REACTIVE PROTEIN: CRP: <0.5 mg/dL (ref <1.0)

## 2022-05-23 LAB — SEDIMENTATION RATE: Sed Rate: 3 mm/hr (ref 0–22)

## 2022-05-23 MED ORDER — ONDANSETRON HCL 4 MG/2ML IJ SOLN
4.0000 mg | Freq: Four times a day (QID) | INTRAMUSCULAR | Status: DC | PRN
  Administered 2022-05-23 – 2022-05-26 (×7): 4 mg via INTRAVENOUS
  Filled 2022-05-23 (×7): qty 2

## 2022-05-23 MED ORDER — METOPROLOL TARTRATE 12.5 MG HALF TABLET
12.5000 mg | ORAL_TABLET | Freq: Two times a day (BID) | ORAL | Status: DC
  Administered 2022-05-25: 12.5 mg via ORAL
  Filled 2022-05-23 (×4): qty 1

## 2022-05-23 MED ORDER — PEG-KCL-NACL-NASULF-NA ASC-C 100 G PO SOLR: 1.0000 | Freq: Once | ORAL | Status: DC

## 2022-05-23 MED ORDER — BISACODYL 10 MG RE SUPP
10.0000 mg | Freq: Once | RECTAL | Status: AC
  Administered 2022-05-23: 10 mg via RECTAL
  Filled 2022-05-23: qty 1

## 2022-05-23 MED ORDER — PEG-KCL-NACL-NASULF-NA ASC-C 100 G PO SOLR
0.5000 | Freq: Once | ORAL | Status: AC
  Administered 2022-05-24: 100 g via ORAL
  Filled 2022-05-23: qty 1

## 2022-05-23 MED ORDER — HYDROMORPHONE HCL 1 MG/ML IJ SOLN
0.5000 mg | INTRAMUSCULAR | Status: DC | PRN
  Administered 2022-05-23 – 2022-05-24 (×4): 0.5 mg via INTRAVENOUS
  Filled 2022-05-23 (×4): qty 0.5

## 2022-05-23 MED ORDER — PEG-KCL-NACL-NASULF-NA ASC-C 100 G PO SOLR
0.5000 | Freq: Once | ORAL | Status: AC
  Administered 2022-05-23: 100 g via ORAL
  Filled 2022-05-23: qty 1
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
## 2022-05-23 NOTE — Progress Notes (Signed)
  Transition of Care [facility]) Screening Note   Patient Details  Name: Kayla Gross Date of Birth: 09/22/1980   Transition of Care [facility]) CM/SW Contact:    Vassie Moselle, LCSW Phone Number: 05/23/2022, 2:48 PM    Transition of Care Department [facility]) has reviewed patient and no TOC needs have been identified at this time. We will continue to monitor patient advancement through interdisciplinary progression rounds. If new patient transition needs arise, please place a TOC consult.

## 2022-05-23 NOTE — Progress Notes (Addendum)
PROGRESS NOTE    Kayla Gross  [id number] DOB: Jul 27, 1980 DOA: 05/22/2022 PCP: Jonathon Jordan, MD     Brief Narrative:  H/o HTN, SVT s/p ablation in 03/2019, currently wearing event monitor, h/o iritis and getting work up for ankylosing spondylitis by rheumatology, h/o Bipolar disorder, chronic back pain, , fibromyalgia presents to [facility] ED for months long symptoms of nausea, mid abdominal pain, right upper quadrant pain, with intermittent diarrhea mixed with blood, vomiting, bloating .    Subjective:  She is npo, awaiting GI eval  Continue to c/o pain, no bm since in the hospital, no fever Reports stool was brown with intermittent blood per rectum   Assessment & Plan:  Principal Problem:   Terminal ileitis ([place]) Active Problems:   Ileitis, terminal ([place])    Assessment and Plan:   Nausea, vomiting, hematochezia, intermittent abdominal pain/ Terminal Ileitis -No leukocytosis, hemoglobin wnl -CT showing possible terminal ileitis.  -Start her on IV Unasyn, IV fluids, IV anti emetics, IV protonix. -add on esr/CRP -Case discussed with [facility] Dr. Paulita Fujita who plan to do EGD colonoscopy tomorrow, appreciate GI input   Fibromyalgia:  Resume home meds.      H/o  SVT with PVC'S: On event monitor and on metoprolol (dose reduced as bp low normal)     H/o ankylosing spondylitis Patient has bilateral iritis and confirmed HLA-B27 per outside record  Outpatient follow up rheumatology   .  I have Reviewed nursing notes, Vitals, pain scores, I/o's, Lab results and  imaging results since pt's last encounter, details please see discussion above  I ordered the following labs:  Unresulted Labs (From admission, onward)     Start     Ordered   05/24/22 0500  CBC with Differential/Platelet  Tomorrow morning,   R        05/23/22 0832   05/24/22 [id number]  Basic metabolic panel  Tomorrow morning,   R        05/23/22 0832   05/24/22 0500  Magnesium  Tomorrow morning,   R         05/23/22 0832   05/24/22 0500  Phosphorus  Tomorrow morning,   R        05/23/22 0832   05/23/22 1210  C-reactive protein  Add-on,   AD        05/23/22 1209   05/22/22 1759  Gastrointestinal Panel by PCR , Stool  (Gastrointestinal Panel by PCR, Stool                                                                                                                                                     **Does Not include CLOSTRIDIUM DIFFICILE testing. **If CDIFF testing is needed, place order from the "C Difficile Testing" order set.**)  Once,   R  05/22/22 1759             DVT prophylaxis: SCDs Start: 05/22/22 1936   Code Status:   Code Status: Full Code  Family Communication: None at bedside Disposition:   Dispo: The patient is from: Home              Anticipated d/c is to: Home              Anticipated d/c date is: Pending EGD and colonoscopy result, need GI clearance  Antimicrobials:    Anti-infectives (From admission, onward)    Start     Dose/Rate Route Frequency Ordered Stop   05/22/22 1900  Ampicillin-Sulbactam (UNASYN) 3 g in sodium chloride 0.9 % 100 mL IVPB        3 g 200 mL/hr over 30 Minutes Intravenous Every 6 hours 05/22/22 1800            Objective: Vitals:   05/22/22 1925 05/23/22 0049 05/23/22 0429 05/23/22 1339  BP: 117/79 118/78 117/78 130/87  Pulse: (!) 49 80 60 (!) 52  Resp: 16 16 16 18  $ Temp: 98 F (36.7 C) 98 F (36.7 C) 98 F (36.7 C) 97.7 F (36.5 C)  TempSrc: Oral Oral Oral Oral  SpO2: 99% 98% 96% 98%  Weight: 78.5 kg     Height: 5' 6"$  (1.676 m)       Intake/Output Summary (Last 24 hours) at 05/23/2022 1526 Last data filed at 05/23/2022 0500 Gross per 24 hour  Intake 461.51 ml  Output --  Net 461.51 ml   Filed Weights   05/22/22 1925  Weight: 78.5 kg    Examination:  General exam: alert, awake, communicative,calm, NAD Respiratory system: Clear to auscultation. Respiratory effort normal. Cardiovascular system:  RRR.   Gastrointestinal system: Abdomen is nondistended, soft and nontender.  Normal bowel sounds heard. Central nervous system: Alert and oriented. No focal neurological deficits. Extremities:  no edema Skin: No rashes, lesions or ulcers Psychiatry: Judgement and insight appear normal. Mood & affect appropriate.     Data Reviewed: I have personally reviewed  labs and visualized  imaging studies since the last encounter and formulate the plan        Scheduled Meds:  bisacodyl  10 mg Rectal Once   gabapentin  300 mg Oral Daily   metoprolol tartrate  12.5 mg Oral BID   pantoprazole (PROTONIX) IV  40 mg Intravenous Q24H   peg 3350 powder  0.5 kit Oral Once   And   [START ON 05/24/2022] peg 3350 powder  0.5 kit Oral Once   Continuous Infusions:  sodium chloride 125 mL/hr at 05/23/22 1247   ampicillin-sulbactam (UNASYN) IV 3 g (05/23/22 1201)     LOS: 0 days   Time spent:  32mns  FFlorencia Reasons MD PhD FACP [facility]  Available via Epic secure chat 7am-7pm for nonurgent issues Please page for urgent issues To page the attending provider between 7A-7P or the covering provider during after hours 7P-7A, please log into the web site [email or web address] and access using universal [place] password for that web site. If you do not have the password, please call the hospital operator.    05/23/2022, 3:26 PM

## 2022-05-23 NOTE — Plan of Care (Signed)

## 2022-05-24 ENCOUNTER — Encounter (HOSPITAL_COMMUNITY)
Admission: EM | Disposition: A | Discharge status: Home / Self Care | Payer: Self-pay | Source: Home / Self Care | Attending: Internal Medicine

## 2022-05-24 ENCOUNTER — Inpatient Hospital Stay (HOSPITAL_COMMUNITY): Payer: BC Managed Care – PPO | Admitting: Certified Registered Nurse Anesthetist

## 2022-05-24 DIAGNOSIS — K50011 Crohn's disease of small intestine with rectal bleeding: Secondary | ICD-10-CM | POA: Diagnosis not present

## 2022-05-24 DIAGNOSIS — I471 Supraventricular tachycardia, unspecified: Secondary | ICD-10-CM | POA: Diagnosis not present

## 2022-05-24 DIAGNOSIS — M450 Ankylosing spondylitis of multiple sites in spine: Secondary | ICD-10-CM | POA: Diagnosis not present

## 2022-05-24 HISTORY — PX: ESOPHAGOGASTRODUODENOSCOPY (EGD) WITH PROPOFOL: SHX5813

## 2022-05-24 HISTORY — PX: COLONOSCOPY WITH PROPOFOL: SHX5780

## 2022-05-24 HISTORY — PX: POLYPECTOMY: SHX5525

## 2022-05-24 HISTORY — PX: HEMOSTASIS CLIP PLACEMENT: SHX6857

## 2022-05-24 HISTORY — PX: BIOPSY: SHX5522

## 2022-05-24 LAB — CBC WITH DIFFERENTIAL/PLATELET
Abs Immature Granulocytes: 0.02 K/uL (ref 0.00–0.07)
Basophils Absolute: 0 K/uL (ref 0.0–0.1)
Basophils Relative: 1 %
Eosinophils Absolute: 0.2 K/uL (ref 0.0–0.5)
Eosinophils Relative: 4 %
HCT: 38.8 % (ref 36.0–46.0)
Hemoglobin: 13 g/dL (ref 12.0–15.0)
Immature Granulocytes: 0 %
Lymphocytes Relative: 28 %
Lymphs Abs: 1.7 K/uL (ref 0.7–4.0)
MCH: 31.6 pg (ref 26.0–34.0)
MCHC: 33.5 g/dL (ref 30.0–36.0)
MCV: 94.2 fL (ref 80.0–100.0)
Monocytes Absolute: 0.4 K/uL (ref 0.1–1.0)
Monocytes Relative: 7 %
Neutro Abs: 3.6 K/uL (ref 1.7–7.7)
Neutrophils Relative %: 60 %
Platelets: 229 K/uL (ref 150–400)
RBC: 4.12 MIL/uL (ref 3.87–5.11)
RDW: 13.2 % (ref 11.5–15.5)
WBC: 6.1 K/uL (ref 4.0–10.5)
nRBC: 0 % (ref 0.0–0.2)

## 2022-05-24 LAB — BASIC METABOLIC PANEL
Anion gap: 8 (ref 5–15)
BUN: <5 mg/dL — ABNORMAL LOW (ref 6–20)
CO2: 22 mmol/L (ref 22–32)
Calcium: 8.6 mg/dL — ABNORMAL LOW (ref 8.9–10.3)
Chloride: 106 mmol/L (ref 98–111)
Creatinine, Ser: 0.61 mg/dL (ref 0.44–1.00)
GFR, Estimated: >60 mL/min (ref >60)
Glucose, Bld: 76 mg/dL (ref 70–99)
Potassium: 4.2 mmol/L (ref 3.5–5.1)
Sodium: 136 mmol/L (ref 135–145)

## 2022-05-24 LAB — PHOSPHORUS: Phosphorus: 2.4 mg/dL — ABNORMAL LOW (ref 2.5–4.6)

## 2022-05-24 LAB — MAGNESIUM: Magnesium: 2 mg/dL (ref 1.7–2.4)

## 2022-05-24 SURGERY — ESOPHAGOGASTRODUODENOSCOPY (EGD) WITH PROPOFOL
Anesthesia: Monitor Anesthesia Care

## 2022-05-24 MED ORDER — MIDAZOLAM HCL 2 MG/2ML IJ SOLN
INTRAMUSCULAR | Status: AC
  Filled 2022-05-24: qty 2

## 2022-05-24 MED ORDER — SODIUM CHLORIDE 0.9 % IV SOLN
3.0000 g | Freq: Four times a day (QID) | INTRAVENOUS | Status: DC
  Administered 2022-05-24: 3 g via INTRAVENOUS
  Filled 2022-05-24 (×2): qty 8

## 2022-05-24 MED ORDER — SODIUM CHLORIDE 0.9 % IV SOLN: INTRAVENOUS | Status: DC

## 2022-05-24 MED ORDER — PROPOFOL 500 MG/50ML IV EMUL
INTRAVENOUS | Status: AC
  Filled 2022-05-24: qty 50

## 2022-05-24 MED ORDER — DEXMEDETOMIDINE HCL IN NACL 80 MCG/20ML IV SOLN
INTRAVENOUS | Status: DC | PRN
  Administered 2022-05-24 (×2): 4 ug via BUCCAL
  Administered 2022-05-24: 8 ug via BUCCAL

## 2022-05-24 MED ORDER — SODIUM CHLORIDE 0.9 % IV SOLN: INTRAVENOUS | Status: AC

## 2022-05-24 MED ORDER — SODIUM PHOSPHATES 45 MMOLE/15ML IV SOLN
15.0000 mmol | Freq: Once | INTRAVENOUS | Status: AC
  Administered 2022-05-24: 15 mmol via INTRAVENOUS
  Filled 2022-05-24: qty 5

## 2022-05-24 MED ORDER — LACTATED RINGERS IV SOLN: INTRAVENOUS | Status: DC

## 2022-05-24 MED ORDER — PROPOFOL 500 MG/50ML IV EMUL
INTRAVENOUS | Status: DC | PRN
  Administered 2022-05-24 (×2): 30 mg via INTRAVENOUS
  Administered 2022-05-24: 50 mg via INTRAVENOUS
  Administered 2022-05-24: 100 ug/kg/min via INTRAVENOUS

## 2022-05-24 MED ORDER — MIDAZOLAM HCL 5 MG/5ML IJ SOLN
INTRAMUSCULAR | Status: DC | PRN
  Administered 2022-05-24: 2 mg via INTRAVENOUS

## 2022-05-24 MED ORDER — HYDROMORPHONE HCL 1 MG/ML IJ SOLN
0.5000 mg | Freq: Four times a day (QID) | INTRAMUSCULAR | Status: DC | PRN
  Administered 2022-05-24 – 2022-05-26 (×4): 0.5 mg via INTRAVENOUS
  Filled 2022-05-24 (×4): qty 0.5

## 2022-05-24 SURGICAL SUPPLY — 25 items

## 2022-05-24 NOTE — Op Note (Signed)
[facility] Patient Name: Kayla Gross Procedure Date: 05/24/2022 MRN: [id number] Attending MD: Arta Silence , MD, [id number] Date of Birth: 11/06/1980 CSN: [id number] Age: 42 Admit Type: Inpatient Procedure:                Upper GI endoscopy Indications:              Generalized abdominal pain, Hematochezia, Nausea                            with vomiting Providers:                Arta Silence, MD, Dulcy Fanny, Janee Morn, Technician Referring MD:             [facility] Medicines:                Monitored Anesthesia Care Complications:            No immediate complications. Estimated Blood Loss:     Estimated blood loss: none. Procedure:                Pre-Anesthesia Assessment:                           - Prior to the procedure, a History and Physical                            was performed, and patient medications and                            allergies were reviewed. The patient's tolerance of                            previous anesthesia was also reviewed. The risks                            and benefits of the procedure and the sedation                            options and risks were discussed with the patient.                            All questions were answered, and informed consent                            was obtained. Prior Anticoagulants: The patient has                            taken no anticoagulant or antiplatelet agents. ASA                            Grade Assessment: II - A patient with mild systemic                            disease.  After reviewing the risks and benefits,                            the patient was deemed in satisfactory condition to                            undergo the procedure.                           After obtaining informed consent, the endoscope was                            passed under direct vision. Throughout the                            procedure, the  patient's blood pressure, pulse, and                            oxygen saturations were monitored continuously. The                            [id number]) Olympus endoscope was introduced                            through the mouth, and advanced to the second part                            of duodenum. The upper GI endoscopy was                            accomplished without difficulty. The patient                            tolerated the procedure well. Scope In: Scope Out: Findings:      The examined esophagus was normal.      Patchy mild inflammation was found in the entire examined stomach.       Biopsies were taken with a cold forceps for histology.      The exam of the stomach was otherwise normal.      The duodenal bulb, first portion of the duodenum and second portion of       the duodenum were normal. Impression:               - Normal esophagus.                           - Gastritis. Biopsied.                           - Normal duodenal bulb, first portion of the                            duodenum and second portion of the duodenum. Moderate Sedation:      Not Applicable - Patient had care per Anesthesia. Recommendation:           - Perform a  colonoscopy today.                           - Await pathology results. Procedure Code(s):        --- Professional ---                           [phone], Esophagogastroduodenoscopy, flexible,                            transoral; with biopsy, single or multiple Diagnosis Code(s):        --- Professional ---                           K29.70, Gastritis, unspecified, without bleeding                           R10.84, Generalized abdominal pain                           K92.1, Melena (includes Hematochezia)                           R11.2, Nausea with vomiting, unspecified CPT copyright 2022 [employer]. All rights reserved. The codes documented in this report are preliminary and upon coder review may  be revised  to meet current compliance requirements. Arta Silence, MD 05/24/2022 1:15:52 PM This report has been signed electronically. Number of Addenda: 0

## 2022-05-24 NOTE — Progress Notes (Addendum)
PROGRESS NOTE    Kayla Gross  [id number] DOB: 04-Sep-1980 DOA: 05/22/2022 PCP: Jonathon Jordan, MD     Brief Narrative:  H/o HTN, SVT s/p ablation in 03/2019, currently wearing event monitor, h/o iritis and getting work up for ankylosing spondylitis by rheumatology, h/o Bipolar disorder, chronic back pain, , fibromyalgia presents to [facility] ED for months long symptoms of nausea, mid abdominal pain, right upper quadrant pain, with intermittent diarrhea mixed with blood, vomiting, bloating .    Subjective:  She is seen after return from EGD and colonoscopy  ESR CRP unremarkable  GI panel in process Stool occult blood negative  Continue to c/o pain, no bm since in the hospital, no fever Reports stool was brown with intermittent blood per rectum   Assessment & Plan:  Principal Problem:   Terminal ileitis (HCC) Active Problems:   Ileitis, terminal ([place])    Assessment and Plan:   Nausea, vomiting, hematochezia, intermittent abdominal pain/ Terminal Ileitis ruled out, ileum normal on colonoscopy -No leukocytosis, hemoglobin wnl -CT showing possible terminal ileitis.  --esr/CRP wnl -s/p EGD showed normal esophagus , positive for gastritis , biopsy pending , normal duodenal bulb first and second portion of duodenum  -Status post colonoscopy with several polyps removed, ileum is normal on colonoscopy - d/c IV Unasyn, continue  IV fluids, IV anti emetics, IV protonix. -f/u on gi pcr panel   Hypophosphatemia Replace phos, recheck in the morning  Fibromyalgia:  Resume home meds.      H/o  SVT with PVC'S: On event monitor and on metoprolol (dose reduced as bp low normal)     H/o ankylosing spondylitis Patient has bilateral iritis and confirmed HLA-B27 per outside record  Outpatient follow up rheumatology   .  I have Reviewed nursing notes, Vitals, pain scores, I/o's, Lab results and  imaging results since pt's last encounter, details please see discussion above  I  ordered the following labs:  Unresulted Labs (From admission, onward)     Start     Ordered   05/25/22 0500  CBC  Tomorrow morning,   R        05/24/22 1704   05/25/22 [id number]  Basic metabolic panel  Tomorrow morning,   R        05/24/22 1704   05/25/22 0500  Phosphorus  Tomorrow morning,   R        05/24/22 1704   05/22/22 1759  Gastrointestinal Panel by PCR , Stool  (Gastrointestinal Panel by PCR, Stool                                                                                                                                                     **Does Not include CLOSTRIDIUM DIFFICILE testing. **If CDIFF testing is needed, place order from the "C Difficile Testing" order set.**)  Once,  R        05/22/22 1759             DVT prophylaxis: SCDs Start: 05/22/22 1936   Code Status:   Code Status: Full Code  Family Communication: None at bedside Disposition:   Dispo: The patient is from: Home              Anticipated d/c is to: Home              Anticipated d/c date is: likely on 2/16, need GI clearance  Antimicrobials:    Anti-infectives (From admission, onward)    Start     Dose/Rate Route Frequency Ordered Stop   05/24/22 1500  Ampicillin-Sulbactam (UNASYN) 3 g in sodium chloride 0.9 % 100 mL IVPB  Status:  Discontinued        3 g 200 mL/hr over 30 Minutes Intravenous Every 6 hours 05/24/22 1437 05/24/22 1704   05/22/22 1900  Ampicillin-Sulbactam (UNASYN) 3 g in sodium chloride 0.9 % 100 mL IVPB  Status:  Discontinued        3 g 200 mL/hr over 30 Minutes Intravenous Every 6 hours 05/22/22 1800 05/24/22 1437          Objective: Vitals:   05/24/22 1310 05/24/22 1320 05/24/22 1330 05/24/22 1434  BP: 125/75 126/74 130/68 132/83  Pulse: 61 85 62 (!) 58  Resp: 15 18 17 18  $ Temp:    98.1 F (36.7 C)  TempSrc:    Oral  SpO2: 97% 96% 99% 100%  Weight:      Height:        Intake/Output Summary (Last 24 hours) at 05/24/2022 1910 Last data filed at 05/24/2022  1258 Gross per 24 hour  Intake 600 ml  Output 300 ml  Net 300 ml   Filed Weights   05/22/22 1925  Weight: 78.5 kg    Examination:  General exam: alert, awake, communicative,calm, NAD Respiratory system: Clear to auscultation. Respiratory effort normal. Cardiovascular system:  RRR.  Gastrointestinal system: Abdomen is nondistended, soft and nontender.  Normal bowel sounds heard. Central nervous system: Alert and oriented. No focal neurological deficits. Extremities:  no edema Skin: No rashes, lesions or ulcers Psychiatry: Judgement and insight appear normal. Mood & affect appropriate.     Data Reviewed: I have personally reviewed  labs and visualized  imaging studies since the last encounter and formulate the plan        Scheduled Meds:  gabapentin  300 mg Oral Daily   metoprolol tartrate  12.5 mg Oral BID   pantoprazole (PROTONIX) IV  40 mg Intravenous Q24H   Continuous Infusions:  sodium chloride 75 mL/hr at 05/24/22 1819   sodium phosphate 15 mmol in dextrose 5 % 250 mL infusion 15 mmol (05/24/22 1820)     LOS: 1 day   Time spent:  6mns  FFlorencia Reasons MD PhD FACP [facility]  Available via Epic secure chat 7am-7pm for nonurgent issues Please page for urgent issues To page the attending provider between 7A-7P or the covering provider during after hours 7P-7A, please log into the web site [email or web address] and access using universal [place] password for that web site. If you do not have the password, please call the hospital operator.    05/24/2022, 7:10 PM

## 2022-05-24 NOTE — Anesthesia Preprocedure Evaluation (Addendum)
Anesthesia Evaluation  Patient identified by MRN, date of birth, ID band Patient awake    Reviewed: Allergy & Precautions, NPO status , Patient's Chart, lab work & pertinent test results  Airway Mallampati: II  TM Distance: >3 FB Neck ROM: Full    Dental  (+) Dental Advisory Given, Poor Dentition   Pulmonary former smoker   breath sounds clear to auscultation       Cardiovascular hypertension, Pt. on medications + dysrhythmias  Rhythm:Regular Rate:Normal  Echocardiogram 05/14/2022: Left ventricle cavity is normal in size and wall thickness. Normal global wall motion. Normal LV systolic function with EF 60%. Normal diastolic filling pattern. Mild tricuspid regurgitation. No evidence of pulmonary hypertension.    Ablation for PVC's   Neuro/Psych  Headaches PSYCHIATRIC DISORDERS Anxiety Depression Bipolar Disorder      GI/Hepatic negative GI ROS, Neg liver ROS,,,  Endo/Other  negative endocrine ROS    Renal/GU negative Renal ROS     Musculoskeletal   Abdominal   Peds  Hematology negative hematology ROS (+)   Anesthesia Other Findings   Reproductive/Obstetrics                             Lab Results  Component Value Date   WBC 6.1 05/24/2022   HGB 13.0 05/24/2022   HCT 38.8 05/24/2022   MCV 94.2 05/24/2022   PLT 229 05/24/2022   Lab Results  Component Value Date   CREATININE 0.61 05/24/2022   BUN <5 (L) 05/24/2022   NA 136 05/24/2022   K 4.2 05/24/2022   CL 106 05/24/2022   CO2 22 05/24/2022    Anesthesia Physical Anesthesia Plan  ASA: 2  Anesthesia Plan: MAC   Post-op Pain Management: Minimal or no pain anticipated   Induction: Intravenous  PONV Risk Score and Plan: 4 or greater and Midazolam, Dexamethasone, Ondansetron, Treatment may vary due to age or medical condition and Propofol infusion  Airway Management Planned: Natural Airway  Additional Equipment:    Intra-op Plan:   Post-operative Plan:   Informed Consent: I have reviewed the patients History and Physical, chart, labs and discussed the procedure including the risks, benefits and alternatives for the proposed anesthesia with the patient or authorized representative who has indicated his/her understanding and acceptance.     Dental advisory given  Plan Discussed with: CRNA  Anesthesia Plan Comments:         Anesthesia Quick Evaluation

## 2022-05-24 NOTE — Transfer of Care (Signed)
Immediate Anesthesia Transfer of Care Note  Patient: Kayla Gross  Procedure(s) Performed: Procedure(s): ESOPHAGOGASTRODUODENOSCOPY (EGD) WITH PROPOFOL (N/A) COLONOSCOPY WITH PROPOFOL (N/A) BIOPSY POLYPECTOMY HEMOSTASIS CLIP PLACEMENT  Patient Location: PACU and Endoscopy Unit  Anesthesia Type:MAC  Level of Consciousness: awake, alert  and oriented  Airway & Oxygen Therapy: Patient Spontanous Breathing and Patient connected to nasal cannula oxygen  Post-op Assessment: Report given to RN and Post -op Vital signs reviewed and stable  Post vital signs: Reviewed and stable  Last Vitals:  Vitals:   05/24/22 0503 05/24/22 1117  BP: 135/76 (!) 147/98  Pulse: 61 65  Resp: 18 16  Temp: 36.6 C 36.6 C  SpO2: 0000000 [id number]    Complications: No apparent anesthesia complications

## 2022-05-24 NOTE — Op Note (Signed)
[facility] Patient Name: Kayla Gross Procedure Date: 05/24/2022 MRN: [id number] Attending MD: Arta Silence , MD, [id number] Date of Birth: 1980/11/24 CSN: [id number] Age: 42 Admit Type: Inpatient Procedure:                Colonoscopy Indications:              Generalized abdominal pain, Hematochezia, Abnormal                            CT of the GI tract Providers:                Arta Silence, MD, Dulcy Fanny, Janee Morn, Technician Referring MD:             [facility] Medicines:                Monitored Anesthesia Care Complications:            No immediate complications. Estimated Blood Loss:     Estimated blood loss: none. Procedure:                Pre-Anesthesia Assessment:                           - Prior to the procedure, a History and Physical                            was performed, and patient medications and                            allergies were reviewed. The patient's tolerance of                            previous anesthesia was also reviewed. The risks                            and benefits of the procedure and the sedation                            options and risks were discussed with the patient.                            All questions were answered, and informed consent                            was obtained. Prior Anticoagulants: The patient has                            taken no anticoagulant or antiplatelet agents. ASA                            Grade Assessment: II - A patient with mild systemic  disease. After reviewing the risks and benefits,                            the patient was deemed in satisfactory condition to                            undergo the procedure.                           After obtaining informed consent, the colonoscope                            was passed under direct vision. Throughout the                            procedure, the  patient's blood pressure, pulse, and                            oxygen saturations were monitored continuously. The                            [id number]) Olympus colonoscope was                            introduced through the anus and advanced to the the                            terminal ileum, with identification of the                            appendiceal orifice and IC valve. Scope In: 12:28:11 PM Scope Out: 12:58:35 PM Scope Withdrawal Time: 0 hours 26 minutes 14 seconds  Total Procedure Duration: 0 hours 30 minutes 24 seconds  Findings:      The perianal and digital rectal examinations were normal.      A 5 mm polyp was found in the rectum. The polyp was sessile. The polyp       was removed with a cold snare. Resection and retrieval were complete.      A 25 mm polyp was found in the distal sigmoid colon. The polyp was       pedunculated. The polyp was removed with a hot snare. Resection and       retrieval were complete. To prevent bleeding post-intervention, two       hemostatic clips were successfully placed. There was no bleeding at the       end of the procedure.      A 15 mm polyp was found in the proximal sigmoid colon. The polyp was       pedunculated. The polyp was removed with a hot snare. Resection and       retrieval were complete. To prevent bleeding post-intervention, two       hemostatic clips were successfully placed. There was no bleeding at the       end of the procedure.      The exam was otherwise normal throughout the examined colon.      The terminal ileum appeared normal. Impression:               -  One 5 mm polyp in the rectum, removed with a cold                            snare. Resected and retrieved.                           - One 25 mm polyp in the distal sigmoid colon,                            removed with a hot snare. Resected and retrieved.                            Clips were placed.                           - One 15 mm polyp in  the proximal sigmoid colon,                            removed with a hot snare. Resected and retrieved.                            Clips were placed.                           - The examined portion of the ileum was normal.                           - Otherwise normal to terminal ileum. Moderate Sedation:      Not Applicable - Patient had care per Anesthesia. Recommendation:           - Return patient to hospital ward for ongoing care.                           - Clear liquid diet today.                           - Continue present medications.                           - Await pathology results.                           - Repeat colonoscopy (date not yet determined) for                            surveillance based on pathology results.                           [facility] will follow. Procedure Code(s):        --- Professional ---                           [phone], Colonoscopy, flexible; with removal of  tumor(s), polyp(s), or other lesion(s) by snare                            technique Diagnosis Code(s):        --- Professional ---                           D12.8, Benign neoplasm of rectum                           D12.5, Benign neoplasm of sigmoid colon                           R10.84, Generalized abdominal pain                           K92.1, Melena (includes Hematochezia)                           R93.3, Abnormal findings on diagnostic imaging of                            other parts of digestive tract CPT copyright 2022 American Medical Association. All rights reserved. The codes documented in this report are preliminary and upon coder review may  be revised to meet current compliance requirements. Arta Silence, MD 05/24/2022 1:25:54 PM This report has been signed electronically. Number of Addenda: 0

## 2022-05-24 NOTE — Interval H&P Note (Signed)
History and Physical Interval Note:  05/24/2022 11:58 AM  Kayla Gross  has presented today for surgery, with the diagnosis of abdominal pain, blood in stool, abnormal CT scan, nausea/vomiting.  The various methods of treatment have been discussed with the patient and family. After consideration of risks, benefits and other options for treatment, the patient has consented to  Procedure(s): ESOPHAGOGASTRODUODENOSCOPY (EGD) WITH PROPOFOL (N/A) COLONOSCOPY WITH PROPOFOL (N/A) as a surgical intervention.  The patient's history has been reviewed, patient examined, no change in status, stable for surgery.  I have reviewed the patient's chart and labs.  Questions were answered to the patient's satisfaction.     [name]

## 2022-05-25 DIAGNOSIS — M450 Ankylosing spondylitis of multiple sites in spine: Secondary | ICD-10-CM | POA: Diagnosis not present

## 2022-05-25 DIAGNOSIS — K50011 Crohn's disease of small intestine with rectal bleeding: Secondary | ICD-10-CM | POA: Diagnosis not present

## 2022-05-25 DIAGNOSIS — I471 Supraventricular tachycardia, unspecified: Secondary | ICD-10-CM | POA: Diagnosis not present

## 2022-05-25 LAB — CBC
HCT: 40.2 % (ref 36.0–46.0)
Hemoglobin: 13.8 g/dL (ref 12.0–15.0)
MCH: 31.6 pg (ref 26.0–34.0)
MCHC: 34.3 g/dL (ref 30.0–36.0)
MCV: 92 fL (ref 80.0–100.0)
Platelets: 227 10*3/uL (ref 150–400)
RBC: 4.37 MIL/uL (ref 3.87–5.11)
RDW: 13.1 % (ref 11.5–15.5)
WBC: 6.3 10*3/uL (ref 4.0–10.5)
nRBC: 0 % (ref 0.0–0.2)

## 2022-05-25 LAB — GASTROINTESTINAL PANEL BY PCR, STOOL (REPLACES STOOL CULTURE)

## 2022-05-25 LAB — BASIC METABOLIC PANEL
Anion gap: 13 (ref 5–15)
BUN: <5 mg/dL — ABNORMAL LOW (ref 6–20)
CO2: 20 mmol/L — ABNORMAL LOW (ref 22–32)
Calcium: 8.7 mg/dL — ABNORMAL LOW (ref 8.9–10.3)
Chloride: 105 mmol/L (ref 98–111)
Creatinine, Ser: 0.71 mg/dL (ref 0.44–1.00)
GFR, Estimated: >60 mL/min (ref >60)
Glucose, Bld: 90 mg/dL (ref 70–99)
Potassium: 3.1 mmol/L — ABNORMAL LOW (ref 3.5–5.1)
Sodium: 138 mmol/L (ref 135–145)

## 2022-05-25 LAB — PHOSPHORUS: Phosphorus: 3.1 mg/dL (ref 2.5–4.6)

## 2022-05-25 LAB — SURGICAL PATHOLOGY

## 2022-05-25 MED ORDER — POTASSIUM CHLORIDE 10 MEQ/100ML IV SOLN
10.0000 meq | INTRAVENOUS | Status: AC
  Administered 2022-05-25 (×4): 10 meq via INTRAVENOUS
  Filled 2022-05-25 (×4): qty 100

## 2022-05-25 MED ORDER — SODIUM CHLORIDE 0.9 % IV SOLN
6.2500 mg | Freq: Four times a day (QID) | INTRAVENOUS | Status: DC | PRN
  Administered 2022-05-25: 6.25 mg via INTRAVENOUS
  Filled 2022-05-25: qty 0.25

## 2022-05-25 MED ORDER — METOPROLOL TARTRATE 25 MG PO TABS
25.0000 mg | ORAL_TABLET | Freq: Two times a day (BID) | ORAL | Status: DC
  Administered 2022-05-25 – 2022-05-26 (×2): 25 mg via ORAL
  Filled 2022-05-25 (×2): qty 1

## 2022-05-25 MED ORDER — LACTATED RINGERS IV SOLN: INTRAVENOUS | Status: DC

## 2022-05-25 NOTE — Progress Notes (Signed)
Subjective: Less abdominal pain. Less nausea/vomiting.  Objective: Vital signs in last 24 hours: Temp:  [98 F (36.7 C)-98.9 F (37.2 C)] 98.9 F (37.2 C) (02/16 1301) Pulse Rate:  [54-72] 54 (02/16 1301) Resp:  [18-20] 18 (02/16 1301) BP: (116-142)/(73-94) 116/73 (02/16 1301) SpO2:  [98 %-100 %] 98 % (02/16 1301) Weight change:  Last BM Date : 05/25/22  PE: GEN:  NAD ABD:  Soft, mild epigastric tenderness  Lab Results: CBC    Component Value Date/Time   WBC 6.3 05/25/2022 0548   RBC 4.37 05/25/2022 0548   HGB 13.8 05/25/2022 0548   HCT 40.2 05/25/2022 0548   PLT 227 05/25/2022 0548   MCV 92.0 05/25/2022 0548   MCH 31.6 05/25/2022 0548   MCHC 34.3 05/25/2022 0548   RDW 13.1 05/25/2022 0548   LYMPHSABS 1.7 05/24/2022 0541   MONOABS 0.4 05/24/2022 0541   EOSABS 0.2 05/24/2022 0541   BASOSABS 0.0 05/24/2022 0541  CMP     Component Value Date/Time   NA 138 05/25/2022 0548   K 3.1 (L) 05/25/2022 0548   CL 105 05/25/2022 0548   CO2 20 (L) 05/25/2022 0548   GLUCOSE 90 05/25/2022 0548   BUN <5 (L) 05/25/2022 0548   CREATININE 0.71 05/25/2022 0548   CALCIUM 8.7 (L) 05/25/2022 0548   PROT 5.5 (L) 05/23/2022 0555   ALBUMIN 3.3 (L) 05/23/2022 0555   AST 14 (L) 05/23/2022 0555   ALT 14 05/23/2022 0555   ALKPHOS 45 05/23/2022 0555   BILITOT 1.1 05/23/2022 0555   GFRNONAA >60 05/25/2022 0548   GFRAA >60 09/10/2019 0929   Assessment:   Nausea/vomiting, resolved. Abdominal pain, improving. Hematochezia, resolved. Large colon polyp, removed. Gastritis, biopsied. Norovirus, not clear that is causing her symptoms of 2+ months in duration.  Plan:   Advance diet as tolerated. No further GI inpatient work-up anticipated. OK to discharge home today. [facility] will follow-up as outpatient.   [name] 05/25/2022, 1:59 PM   Cell [phone] If no answer or after 5 PM call [phone]

## 2022-05-25 NOTE — Anesthesia Postprocedure Evaluation (Signed)
Anesthesia Post Note  Patient: Kayla Gross  Procedure(s) Performed: ESOPHAGOGASTRODUODENOSCOPY (EGD) WITH PROPOFOL COLONOSCOPY WITH PROPOFOL BIOPSY POLYPECTOMY HEMOSTASIS CLIP PLACEMENT     Patient location during evaluation: PACU Anesthesia Type: MAC Level of consciousness: awake and alert Pain management: pain level controlled Vital Signs Assessment: post-procedure vital signs reviewed and stable Respiratory status: spontaneous breathing Cardiovascular status: stable Anesthetic complications: no  No notable events documented.  Last Vitals:  Vitals:   05/24/22 2128 05/25/22 0420  BP: 133/86 (!) 142/94  Pulse: (!) 59 72  Resp: 18 20  Temp: 36.7 C 36.7 C  SpO2: 99% 99%    Last Pain:  Vitals:   05/25/22 0512  TempSrc:   PainSc: 2                  Nolon Nations

## 2022-05-25 NOTE — Progress Notes (Addendum)
PROGRESS NOTE    Kayla Gross  [id number] DOB: [birth year] DOA: 05/22/2022 PCP: Jonathon Jordan, MD     Brief Narrative:  H/o HTN, SVT s/p ablation in 03/2019, currently wearing event monitor, h/o iritis and getting work up for ankylosing spondylitis by rheumatology, h/o Bipolar disorder, chronic back pain, , fibromyalgia presents to [facility] ED for months long symptoms of nausea, mid abdominal pain, right upper quadrant pain, with intermittent diarrhea mixed with blood, vomiting, bloating .    Subjective:   She is vomiting, GI pcr panel + Norovirus  Reports bloody bm this am  No fever    Assessment & Plan:  Principal Problem:   Terminal ileitis ([place]) Active Problems:   Ileitis, terminal (HCC)    Assessment and Plan:   Nausea, vomiting, hematochezia, intermittent abdominal pain/ Terminal Ileitis ruled out, ileum normal on colonoscopy -No leukocytosis, hemoglobin wnl -CT showing possible terminal ileitis.  --esr/CRP wnl -s/p EGD showed normal esophagus , positive for gastritis , biopsy pending , normal duodenal bulb first and second portion of duodenum  -Status post colonoscopy with several polyps removed, ileum is normal on colonoscopy -g/I pcr panel + norovirus  - continue  IV fluids, IV anti emetics, IV protonix. Stop home meds naproxen  Hypokalemia,. Replace ,recheck Hypophosphatemia, Replaced, improved   Fibromyalgia:  Resume home meds.      H/o  SVT with PVC'S: On event monitor and on metoprolol (dose reduced as bp low normal)     H/o ankylosing spondylitis Patient has bilateral iritis and confirmed HLA-B27 per outside record  Outpatient follow up rheumatology   .  I have Reviewed nursing notes, Vitals, pain scores, I/o's, Lab results and  imaging results since pt's last encounter, details please see discussion above  I ordered the following labs:  Unresulted Labs (From admission, onward)     Start     Ordered   05/26/22 0500  Magnesium  Tomorrow  morning,   R        05/25/22 1641   05/26/22 0500  CBC  Tomorrow morning,   R        05/25/22 1641   05/26/22 [id number]  Basic metabolic panel  Tomorrow morning,   R        05/25/22 1641             DVT prophylaxis: SCDs Start: 05/22/22 1936   Code Status:   Code Status: Full Code  Family Communication: None at bedside Disposition:   Dispo: The patient is from: Home              Anticipated d/c is to: Home              Anticipated d/c date is: likely on 2/16, need GI clearance  Antimicrobials:    Anti-infectives (From admission, onward)    Start     Dose/Rate Route Frequency Ordered Stop   05/24/22 1500  Ampicillin-Sulbactam (UNASYN) 3 g in sodium chloride 0.9 % 100 mL IVPB  Status:  Discontinued        3 g 200 mL/hr over 30 Minutes Intravenous Every 6 hours 05/24/22 1437 05/24/22 1704   05/22/22 1900  Ampicillin-Sulbactam (UNASYN) 3 g in sodium chloride 0.9 % 100 mL IVPB  Status:  Discontinued        3 g 200 mL/hr over 30 Minutes Intravenous Every 6 hours 05/22/22 1800 05/24/22 1437          Objective: Vitals:   05/24/22 1434 05/24/22 2128 05/25/22 0420 05/25/22  1301  BP: 132/83 133/86 (!) 142/94 116/73  Pulse: (!) 58 (!) 59 72 (!) 54  Resp: 18 18 20 18  $ Temp: 98.1 F (36.7 C) 98 F (36.7 C) 98 F (36.7 C) 98.9 F (37.2 C)  TempSrc: Oral Oral Oral Oral  SpO2: 100% 99% 99% 98%  Weight:      Height:        Intake/Output Summary (Last 24 hours) at 05/25/2022 1641 Last data filed at 05/25/2022 0912 Gross per 24 hour  Intake 827.21 ml  Output 400 ml  Net 427.21 ml   Filed Weights   05/22/22 1925  Weight: 78.5 kg    Examination:  General exam: alert, awake, communicative,calm, NAD Respiratory system: Clear to auscultation. Respiratory effort normal. Cardiovascular system:  RRR.  Gastrointestinal system: Abdomen is nondistended, soft and nontender.  Normal bowel sounds heard. Central nervous system: Alert and oriented. No focal neurological  deficits. Extremities:  no edema Skin: No rashes, lesions or ulcers Psychiatry: Judgement and insight appear normal. Mood & affect appropriate.     Data Reviewed: I have personally reviewed  labs and visualized  imaging studies since the last encounter and formulate the plan        Scheduled Meds:  gabapentin  300 mg Oral Daily   metoprolol tartrate  25 mg Oral BID   pantoprazole (PROTONIX) IV  40 mg Intravenous Q24H   Continuous Infusions:  sodium chloride 75 mL/hr at 05/25/22 0525   lactated ringers 100 mL/hr at 05/25/22 1007   promethazine (PHENERGAN) injection (IM or IVPB) 6.25 mg (05/25/22 0927)     LOS: 2 days   Time spent:  59mns  FFlorencia Reasons MD PhD FACP [facility]  Available via Epic secure chat 7am-7pm for nonurgent issues Please page for urgent issues To page the attending provider between 7A-7P or the covering provider during after hours 7P-7A, please log into the web site [email or web address] and access using universal [place] password for that web site. If you do not have the password, please call the hospital operator.    05/25/2022, 4:41 PM

## 2022-05-26 DIAGNOSIS — A0811 Acute gastroenteropathy due to Norwalk agent: Secondary | ICD-10-CM | POA: Diagnosis not present

## 2022-05-26 DIAGNOSIS — E876 Hypokalemia: Secondary | ICD-10-CM

## 2022-05-26 DIAGNOSIS — M450 Ankylosing spondylitis of multiple sites in spine: Secondary | ICD-10-CM | POA: Diagnosis not present

## 2022-05-26 DIAGNOSIS — K2970 Gastritis, unspecified, without bleeding: Secondary | ICD-10-CM | POA: Insufficient documentation

## 2022-05-26 DIAGNOSIS — M459 Ankylosing spondylitis of unspecified sites in spine: Secondary | ICD-10-CM | POA: Insufficient documentation

## 2022-05-26 DIAGNOSIS — K635 Polyp of colon: Secondary | ICD-10-CM | POA: Insufficient documentation

## 2022-05-26 LAB — BASIC METABOLIC PANEL
Anion gap: 10 (ref 5–15)
BUN: <5 mg/dL — ABNORMAL LOW (ref 6–20)
CO2: 23 mmol/L (ref 22–32)
Calcium: 9.1 mg/dL (ref 8.9–10.3)
Chloride: 105 mmol/L (ref 98–111)
Creatinine, Ser: 0.64 mg/dL (ref 0.44–1.00)
GFR, Estimated: >60 mL/min (ref >60)
Glucose, Bld: 73 mg/dL (ref 70–99)
Potassium: 3.2 mmol/L — ABNORMAL LOW (ref 3.5–5.1)
Sodium: 138 mmol/L (ref 135–145)

## 2022-05-26 LAB — CBC
HCT: 38.2 % (ref 36.0–46.0)
Hemoglobin: 13 g/dL (ref 12.0–15.0)
MCH: 31.4 pg (ref 26.0–34.0)
MCHC: 34 g/dL (ref 30.0–36.0)
MCV: 92.3 fL (ref 80.0–100.0)
Platelets: 258 10*3/uL (ref 150–400)
RBC: 4.14 MIL/uL (ref 3.87–5.11)
RDW: 13.1 % (ref 11.5–15.5)
WBC: 7.2 10*3/uL (ref 4.0–10.5)
nRBC: 0 % (ref 0.0–0.2)

## 2022-05-26 LAB — MAGNESIUM: Magnesium: 1.9 mg/dL (ref 1.7–2.4)

## 2022-05-26 MED ORDER — PANTOPRAZOLE SODIUM 40 MG PO TBEC: 40.0000 mg | DELAYED_RELEASE_TABLET | Freq: Two times a day (BID) | ORAL | 0 refills | Status: DC

## 2022-05-26 MED ORDER — POTASSIUM CHLORIDE CRYS ER 20 MEQ PO TBCR: 40.0000 meq | EXTENDED_RELEASE_TABLET | Freq: Every day | ORAL | 0 refills | Status: DC

## 2022-05-26 MED ORDER — POTASSIUM CHLORIDE CRYS ER 20 MEQ PO TBCR
40.0000 meq | EXTENDED_RELEASE_TABLET | ORAL | Status: DC
  Administered 2022-05-26: 40 meq via ORAL
  Filled 2022-05-26: qty 2

## 2022-05-26 NOTE — Discharge Summary (Addendum)
Discharge Summary  Kayla Gross [id number] DOB: 01/26/1981  PCP: Jonathon Jordan, MD  Admit date: 05/22/2022 Discharge date: 05/26/2022    Time spent: 31mns  Recommendations for Outpatient Follow-up:  F/u with PCP within a week  for hospital discharge follow up, repeat cbc/bmp at follow up [facility]   Discharge Diagnoses:  Active Hospital Problems   Diagnosis Date Noted   Enteritis due to Norovirus 05/26/2022   Hypokalemia 05/26/2022   Hypophosphatemia 05/26/2022   Ankylosing spondylitis ([place] 05/26/2022  Gastritis  Colon polyps                   Discharge Condition: stable  Diet recommendation: heart healthy  Filed Weights   05/22/22 1925  Weight: 78.5 kg    History of present illness: (Per admitting MD Dr. AKarleen Hampshire HPI: [name] is a 42y.o. female with medical history significant of Bipolar disorder, chronic back pain, ankylosing spondylitis,Iritis, fibromyalgia [facility] symptoms of nausea, mid abdominal pain, right upper quadrant pain, with intermittent diarrhea mixed with blood, vomiting, bloating . She reports the symptoms worsened since January. She denies fever or chills. She denies cough, sob or chest pain or palpitations she denies any syncope. She reports having many PVC'S and tachycardia , was seen by cardiology,  currently she is on a event monitor. He denies urinary symptoms.      ED work up: She is afebrile, normotensive.  Labs are significant for hemoglobin of 15.2.  UA is negative. Stool for occult blood is negative.  [place] is negative.  EKG shows sinus bradycardia, left atrial enlargement.  CT angio of the abdomen showed Small focus of vague enhancement in the lumen of the distal sigmoid colon measuring 1.0 cm, without associated colonic wall thickening, diverticulosis or pericolonic fat stranding. This finding is nonspecific and could represent a small focus of active intraluminal hemorrhage in  the setting of lower GI bleeding versus a small enhancing colonic lesion. Suggest GI consultation for consideration of colonoscopy correlation. 2. Mild wall thickening throughout the terminal ileum with some associated submucosal fat, with mildly increased wall thickening from 04/18/2020 CT. Findings are compatible with a nonspecific chronic infectious or inflammatory terminal ileitis, with the differential including Crohn disease. No bowel obstruction.     [facility] GI consulted ( Dr OPaulita Fujita [facility]   Hospital Course:  Active Problems:   Enteritis due to Norovirus   Hypokalemia   Hypophosphatemia   Ankylosing spondylitis (HCC)   Gastritis   Colon polyps   Assessment and Plan:  Norovirus infection Gastritis She presents with Nausea, vomiting, hematochezia, intermittent abdominal pain  Terminal Ileitis ruled out, ileum normal on colonoscopy -No leukocytosis, hemoglobin wnl -CT showing possible terminal ileitis.  --esr/CRP wnl -s/p EGD showed normal esophagus , positive for gastritis , biopsy pending , normal duodenal bulb first and second portion of duodenum  -Status post colonoscopy with several polyps removed, ileum is normal on colonoscopy -g/I pcr panel + norovirus  - she received   IV fluids, IV anti emetics, IV protonix, she has improved ,she desires to go home - d/c with protonix, Stop home meds naproxen -f/u with pcp and GI   Hypokalemia,. Replaced ,repeat bmp at hospital discharge follow-up Hypophosphatemia, Replaced, improved    Fibromyalgia:  Resume home meds.      H/o  SVT with PVC'S: On event  monitor and on metoprolol, continue  Hypertension Continue Norvasc and metoprolol Follow-up with PCP     H/o ankylosing spondylitis Patient has bilateral iritis and confirmed HLA-B27 per outside record  Outpatient follow up rheumatology    Discharge Exam: BP 135/85 (BP Location:  Left Arm)   Pulse 62   Temp 97.6 F (36.4 C) (Oral)   Resp 18   Ht 5' 6"$  (1.676 m)   Wt 78.5 kg   LMP 11/02/2020 (Approximate)   SpO2 98%   BMI 27.93 kg/m   General: NAD Cardiovascular: RRR Respiratory: Normal respiratory effort    Discharge Instructions     Diet general   Complete by: As directed    Increase activity slowly   Complete by: As directed       Allergies as of 05/26/2022   No Known Allergies      Medication List     STOP taking these medications    naproxen 500 MG tablet Commonly known as: NAPROSYN       TAKE these medications    amLODipine 2.5 MG tablet Commonly known as: NORVASC Take 2.5 mg by mouth daily.   baclofen 10 MG tablet Commonly known as: LIORESAL Take 10 mg by mouth 3 (three) times daily.   gabapentin 300 MG capsule Commonly known as: NEURONTIN TAKE 1 CAPSULE BY MOUTH AT BEDTIME AS NEEDED (PAIN). What changed: See the new instructions.   hydrOXYzine 25 MG tablet Commonly known as: ATARAX Take 25 mg by mouth 3 (three) times daily.   metoprolol succinate 100 MG 24 hr tablet Commonly known as: TOPROL-XL Take 100 mg by mouth daily.   pantoprazole 40 MG tablet Commonly known as: Protonix Take 1 tablet (40 mg total) by mouth 2 (two) times daily before a meal.   potassium chloride SA 20 MEQ tablet Commonly known as: KLOR-CON M Take 2 tablets (40 mEq total) by mouth daily for 3 days.   SUMAtriptan 50 MG tablet Commonly known as: Imitrex Take 1 tablet (50 mg total) by mouth every 2 (two) hours as needed for migraine. May repeat in 2 hours if headache persists or recurs.       No Known Allergies  Follow-up Information     Jonathon Jordan, MD Follow up.   Specialty: Family Medicine Why: hospital discharge follow up, repeat basic lab works including cbc/bmp. Contact information: [place] [address] [phone]         Arta Silence, MD Follow up.   Specialty:  Gastroenterology Why: f/u with EGD and colonoscopy biopsy result, GI will tell you when to repeat colonoscopy depends on biopsy result.  you are prescribed a month supply of protonix to help gastritis to heal, please ask GI or your pcp to give you refill before you run out this meds, please ask Gi and your pcp regarding duration of protonix Contact information: [address] [place] [address] [phone]                  The results of significant diagnostics from this hospitalization (including imaging, microbiology, ancillary and laboratory) are listed below for reference.    Significant Diagnostic Studies: CT ANGIO GI BLEED  Result Date: 05/22/2022 CLINICAL DATA:  Bloody diarrhea and hematochezia for 1 month with abdominal pain, nausea and vomiting. EXAM: CTA ABDOMEN AND PELVIS WITHOUT AND WITH CONTRAST TECHNIQUE: Multidetector CT imaging of the abdomen and pelvis was performed using the standard protocol before and during bolus administration of  intravenous contrast. Multiplanar reconstructed images and MIPs were obtained and reviewed to evaluate the vascular anatomy. RADIATION DOSE REDUCTION: This exam was performed according to the departmental dose-optimization program which includes automated exposure control, adjustment of the mA and/or kV according to patient size and/or use of iterative reconstruction technique. CONTRAST:  69m OMNIPAQUE IOHEXOL 350 MG/ML SOLN COMPARISON:  04/18/2020 CT abdomen/pelvis. FINDINGS: Lower chest: No significant pulmonary nodules or acute consolidative airspace disease. Hepatobiliary: Normal liver size. No liver mass. Normal gallbladder with no radiopaque cholelithiasis. No biliary ductal dilatation. Pancreas: Normal, with no mass or duct dilation. Spleen: Normal size. No mass. Adrenals/Urinary Tract: Normal adrenals. No renal stones. No hydronephrosis. Normal bladder. Stomach/Bowel: Normal non-distended stomach. Normal caliber small  bowel. Mild wall thickening throughout the terminal ileum with some associated submucosal fat, with mildly increased wall thickening from 04/18/2020 CT. No additional sites of small bowel wall thickening. Normal appendix. There is a small focus of vague enhancement in the lumen of the distal sigmoid colon measuring 1.0 x 1.0 cm (series 6/image 149). No associated colonic wall thickening, diverticulosis or pericolonic fat stranding. Otherwise no potential sites of intraluminal hemorrhage in the bowel. Vascular/Lymphatic: Normal caliber abdominal aorta. Patent portal, splenic, hepatic and renal veins. No pathologically enlarged lymph nodes in the abdomen or pelvis. Reproductive: Status post hysterectomy, with no abnormal findings at the vaginal cuff. No adnexal mass. Other: No pneumoperitoneum, ascites or focal fluid collection. Chronic 2.0 x 2.0 cm left lower presacral perineural cystic lesion (series 11/image 60), stable from 04/18/2020 CT, presumably benign. Musculoskeletal: No aggressive appearing focal osseous lesions. Mild degenerative disc disease at L5-S1. IMPRESSION: 1. Small focus of vague enhancement in the lumen of the distal sigmoid colon measuring 1.0 cm, without associated colonic wall thickening, diverticulosis or pericolonic fat stranding. This finding is nonspecific and could represent a small focus of active intraluminal hemorrhage in the setting of lower GI bleeding versus a small enhancing colonic lesion. Suggest GI consultation for consideration of colonoscopy correlation. 2. Mild wall thickening throughout the terminal ileum with some associated submucosal fat, with mildly increased wall thickening from 04/18/2020 CT. Findings are compatible with a nonspecific chronic infectious or inflammatory terminal ileitis, with the differential including Crohn disease. No bowel obstruction. Electronically Signed   By: JIlona SorrelM.D.   On: 05/22/2022 11:56   [place]Abdomen Limited RUQ (LIVER/GB)  Result  Date: 05/22/2022 CLINICAL DATA:  Epigastric pain for 2 days EXAM: ULTRASOUND ABDOMEN LIMITED RIGHT UPPER QUADRANT COMPARISON:  04/18/2020 CT abdomen/pelvis FINDINGS: Gallbladder: No gallstones or wall thickening visualized. No sonographic Murphy sign noted by sonographer. Common bile duct: Diameter: 2 mm Liver: No focal lesion identified. Within normal limits in parenchymal echogenicity. Portal vein is patent on color Doppler imaging with normal direction of blood flow towards the liver. Other: None. IMPRESSION: Normal right upper quadrant ultrasound.  No cholelithiasis. Electronically Signed   By: JIlona SorrelM.D.   On: 05/22/2022 11:15   PCV ECHOCARDIOGRAM COMPLETE  Result Date: 05/15/2022 Echocardiogram 05/14/2022: Left ventricle cavity is normal in size and wall thickness. Normal global wall motion. Normal LV systolic function with EF 60%. Normal diastolic filling pattern. Mild tricuspid regurgitation. No evidence of pulmonary hypertension.    Microbiology: Recent Results (from the past 240 hour(s))  Gastrointestinal Panel by PCR , Stool     Status: Abnormal   Collection Time: 05/22/22  5:59 PM   Specimen: Stool  Result Value Ref Range Status   Campylobacter species NOT DETECTED NOT DETECTED Final   Plesimonas  shigelloides NOT DETECTED NOT DETECTED Final   Salmonella species NOT DETECTED NOT DETECTED Final   Yersinia enterocolitica NOT DETECTED NOT DETECTED Final   Vibrio species NOT DETECTED NOT DETECTED Final   Vibrio cholerae NOT DETECTED NOT DETECTED Final   Enteroaggregative E coli (EAEC) NOT DETECTED NOT DETECTED Final   Enteropathogenic E coli (EPEC) NOT DETECTED NOT DETECTED Final   Enterotoxigenic E coli (ETEC) NOT DETECTED NOT DETECTED Final   Shiga like toxin producing E coli (STEC) NOT DETECTED NOT DETECTED Final   Shigella/Enteroinvasive E coli (EIEC) NOT DETECTED NOT DETECTED Final   Cryptosporidium NOT DETECTED NOT DETECTED Final   Cyclospora cayetanensis NOT DETECTED NOT  DETECTED Final   Entamoeba histolytica NOT DETECTED NOT DETECTED Final   Giardia lamblia NOT DETECTED NOT DETECTED Final   Adenovirus F40/41 NOT DETECTED NOT DETECTED Final   Astrovirus NOT DETECTED NOT DETECTED Final   Norovirus GI/GII DETECTED (A) NOT DETECTED Final    Comment: RESULT CALLED TO, READ BACK BY AND VERIFIED WITH: LATOSHA BRADSHAW AT 0510 05/25/22.PMF    Rotavirus A NOT DETECTED NOT DETECTED Final   Sapovirus (I, II, IV, and V) NOT DETECTED NOT DETECTED Final    Comment: Performed at [facility], [address] [place], [place] [address]     Labs: Basic Metabolic Panel: Recent Labs  Lab 05/22/22 1026 05/23/22 0555 05/24/22 0541 05/25/22 0548 05/26/22 0522  NA 137 135 136 138 138  K 3.9 4.0 4.2 3.1* 3.2*  CL 105 106 106 105 105  CO2 24 22 22 $ 20* 23  GLUCOSE 97 83 76 90 73  BUN 6 <5* <5* <5* <5*  CREATININE 0.73 0.67 0.61 0.71 0.64  CALCIUM 9.6 8.2* 8.6* 8.7* 9.1  MG  --   --  2.0  --  1.9  PHOS  --   --  2.4* 3.1  --    Liver Function Tests: Recent Labs  Lab 05/22/22 1026 05/23/22 0555  AST 18 14*  ALT 15 14  ALKPHOS 69 45  BILITOT 1.2 1.1  PROT 7.4 5.5*  ALBUMIN 5.1* 3.3*   Recent Labs  Lab 05/22/22 1026  LIPASE 20   No results for input(s): "AMMONIA" in the last 168 hours. CBC: Recent Labs  Lab 05/22/22 1026 05/23/22 0555 05/24/22 0541 05/25/22 0548 05/26/22 0522  WBC 7.1 5.4 6.1 6.3 7.2  NEUTROABS 4.7  --  3.6  --   --   HGB 15.2* 12.3 13.0 13.8 13.0  HCT 43.1 36.8 38.8 40.2 38.2  MCV 90.2 94.8 94.2 92.0 92.3  PLT 299 217 229 227 258   Cardiac Enzymes: No results for input(s): "CKTOTAL", "CKMB", "CKMBINDEX", "TROPONINI" in the last 168 hours. BNP: BNP (last 3 results) No results for input(s): "BNP" in the last 8760 hours.  ProBNP (last 3 results) No results for input(s): "PROBNP" in the last 8760 hours.  CBG: No results for input(s): "GLUCAP" in the last 168 hours.  FURTHER DISCHARGE INSTRUCTIONS:   Get  Medicines reviewed and adjusted: Please take all your medications with you for your next visit with your Primary MD   Laboratory/radiological data: Please request your Primary MD to go over all hospital tests and procedure/radiological results at the follow up, please ask your Primary MD to get all Hospital records sent to his/her office.   In some cases, they will be blood work, cultures and biopsy results pending at the time of your discharge. Please request that your primary care M.D. goes through all the  records of your hospital data and follows up on these results.   Also Note the following: If you experience worsening of your admission symptoms, develop shortness of breath, life threatening emergency, suicidal or homicidal thoughts you must seek medical attention immediately by calling 911 or calling your MD immediately  if symptoms less severe.   You must read complete instructions/literature along with all the possible adverse reactions/side effects for all the Medicines you take and that have been prescribed to you. Take any new Medicines after you have completely understood and accpet all the possible adverse reactions/side effects.    Do not drive when taking Pain medications or sleeping medications (Benzodaizepines)   Do not take more than prescribed Pain, Sleep and Anxiety Medications. It is not advisable to combine anxiety,sleep and pain medications without talking with your primary care practitioner   Special Instructions: If you have smoked or chewed Tobacco  in the last 2 yrs please stop smoking, stop any regular Alcohol  and or any Recreational drug use.   Wear Seat belts while driving.   Please note: You were cared for by a hospitalist during your hospital stay. Once you are discharged, your primary care physician will handle any further medical issues. Please note that NO REFILLS for any discharge medications will be authorized once you are discharged, as it is imperative  that you return to your primary care physician (or establish a relationship with a primary care physician if you do not have one) for your post hospital discharge needs so that they can reassess your need for medications and monitor your lab values.     Signed:  Florencia Reasons MD, PhD, FACP  [facility] 05/26/2022, 8:09 AM

## 2022-05-27 ENCOUNTER — Encounter (HOSPITAL_COMMUNITY): Payer: Self-pay | Admitting: Gastroenterology

## 2022-06-01 DIAGNOSIS — K529 Noninfective gastroenteritis and colitis, unspecified: Secondary | ICD-10-CM | POA: Diagnosis not present

## 2022-06-01 DIAGNOSIS — M459 Ankylosing spondylitis of unspecified sites in spine: Secondary | ICD-10-CM | POA: Diagnosis not present

## 2022-06-04 DIAGNOSIS — I471 Supraventricular tachycardia, unspecified: Secondary | ICD-10-CM | POA: Diagnosis not present

## 2022-06-04 DIAGNOSIS — R002 Palpitations: Secondary | ICD-10-CM | POA: Diagnosis not present

## 2022-06-13 DIAGNOSIS — U071 COVID-19: Secondary | ICD-10-CM | POA: Diagnosis not present

## 2022-06-20 ENCOUNTER — Ambulatory Visit: Payer: BC Managed Care – PPO | Admitting: Internal Medicine

## 2022-06-20 VITALS — BP 112/83 | HR 63 | Ht 66.0 in | Wt 170.0 lb

## 2022-06-20 DIAGNOSIS — I471 Supraventricular tachycardia, unspecified: Secondary | ICD-10-CM | POA: Diagnosis not present

## 2022-06-20 DIAGNOSIS — R9431 Abnormal electrocardiogram [ECG] [EKG]: Secondary | ICD-10-CM | POA: Diagnosis not present

## 2022-06-20 DIAGNOSIS — R002 Palpitations: Secondary | ICD-10-CM | POA: Diagnosis not present

## 2022-06-20 DIAGNOSIS — M450 Ankylosing spondylitis of multiple sites in spine: Secondary | ICD-10-CM | POA: Diagnosis not present

## 2022-06-20 NOTE — Progress Notes (Signed)
Primary Physician/Referring:  Jonathon Jordan, MD  Patient ID: Kayla Gross, female    DOB: July 18, 1980, 42 y.o.   MRN: [id number]  Chief Complaint  Patient presents with   paroxysmal supraventricular tachycardia   Follow-up   Results   HPI:    Kayla Gross  is a 42 y.o. female with history significant for hypertension, hyperlipidemia, and ankylosing spondylitis who is here for a follow-up visit. She recently ended up in this hospital due to GI issues and was found to have multiple colonic polyps which have been removed. She is likely going to have repeat colonoscopy within the next few months. While she was in the hospital, she did get a CT scan and ankylosing spondylitis was confirmed. Patient is going back to her rheumatologist soon to initiate treatment. Patient denies diaphoresis, syncope, orthopnea, edema, claudication, PND  Past Medical History:  Diagnosis Date   Anxiety    Bipolar 1 disorder ([place])    HX OF ABUSE   Chronic headache    Chronic low back pain    Depression    Dysrhythmia    PVCS   Pectus excavatum    Vitamin D deficiency    Past Surgical History:  Procedure Laterality Date   ABLATION     cardiac for irregular heart rhythm   BIOPSY  05/24/2022   Procedure: BIOPSY;  Surgeon: Arta Silence, MD;  Location: [facility];  Service: Gastroenterology;;   COLONOSCOPY WITH PROPOFOL N/A 05/24/2022   Procedure: COLONOSCOPY WITH PROPOFOL;  Surgeon: Arta Silence, MD;  Location: [facility];  Service: Gastroenterology;  Laterality: N/A;   ESOPHAGOGASTRODUODENOSCOPY (EGD) WITH PROPOFOL N/A 05/24/2022   Procedure: ESOPHAGOGASTRODUODENOSCOPY (EGD) WITH PROPOFOL;  Surgeon: Arta Silence, MD;  Location: [facility];  Service: Gastroenterology;  Laterality: N/A;   HEMOSTASIS CLIP PLACEMENT  05/24/2022   Procedure: HEMOSTASIS CLIP PLACEMENT;  Surgeon: Arta Silence, MD;  Location: [facility];  Service: Gastroenterology;;   HERNIA REPAIR  2021   POLYPECTOMY   05/24/2022   Procedure: POLYPECTOMY;  Surgeon: Arta Silence, MD;  Location: [facility];  Service: Gastroenterology;;   TONSILLECTOMY     VAGINAL HYSTERECTOMY N/A 11/30/2020   Procedure: HYSTERECTOMY TOTAL VAGINAL WITH BILATERAL SALPINGECTOMY.;  Surgeon: Drema Dallas, DO;  Location: [place];  Service: Gynecology;  Laterality: N/A;   Family History  Problem Relation Age of Onset   Diabetes Sister     Social History   Tobacco Use   Smoking status: Former    Packs/day: 0.25    Years: 19.00    Total pack years: 4.75    Types: Cigarettes    Start date: 18    Quit date: 08/08/2019    Years since quitting: 2.8   Smokeless tobacco: Never  Substance Use Topics   Alcohol use: Yes    Comment: occ   Marital Status: Married  ROS  Review of Systems  Cardiovascular:  Positive for irregular heartbeat and palpitations. Negative for chest pain and near-syncope.  Musculoskeletal:  Positive for arthritis, back pain and neck pain.   Objective  Blood pressure 112/83, pulse 63, height '5\' 6"'$  (1.676 m), weight 170 lb (77.1 kg), last menstrual period 11/02/2020, SpO2 98 %. Body mass index is 27.44 kg/m.     06/20/2022   10:31 AM 05/26/2022    4:55 AM 05/25/2022    8:52 PM  Vitals with BMI  Height '5\' 6"'$     Weight 170 lbs    BMI [id number]    Systolic [id number]  Diastolic 83 85  85  Pulse 63 62 59     Physical Exam Vitals reviewed.  HENT:     Head: Normocephalic and atraumatic.  Cardiovascular:     Rate and Rhythm: Normal rate and regular rhythm.     Pulses: Normal pulses.     Heart sounds: Normal heart sounds. No murmur heard. Pulmonary:     Effort: Pulmonary effort is normal.     Breath sounds: Normal breath sounds.  Abdominal:     General: Bowel sounds are normal.  Musculoskeletal:     Right lower leg: No edema.     Left lower leg: No edema.  Skin:    General: Skin is warm and dry.  Neurological:     Mental Status: She is alert.     Medications and allergies  No Known  Allergies   Medication list after today's encounter   Current Outpatient Medications:    amLODipine (NORVASC) 2.5 MG tablet, Take 2.5 mg by mouth daily., Disp: , Rfl:    baclofen (LIORESAL) 10 MG tablet, Take 10 mg by mouth 3 (three) times daily., Disp: , Rfl:    gabapentin (NEURONTIN) 300 MG capsule, TAKE 1 CAPSULE BY MOUTH AT BEDTIME AS NEEDED (PAIN). (Patient taking differently: Take 300 mg by mouth 3 (three) times daily.), Disp: 30 capsule, Rfl: 5   hydrOXYzine (ATARAX) 25 MG tablet, Take 25 mg by mouth 3 (three) times daily., Disp: , Rfl:    metoprolol succinate (TOPROL-XL) 100 MG 24 hr tablet, Take 100 mg by mouth daily., Disp: , Rfl:    pantoprazole (PROTONIX) 40 MG tablet, Take 1 tablet (40 mg total) by mouth 2 (two) times daily before a meal., Disp: 60 tablet, Rfl: 0   SUMAtriptan (IMITREX) 50 MG tablet, Take 1 tablet (50 mg total) by mouth every 2 (two) hours as needed for migraine. May repeat in 2 hours if headache persists or recurs., Disp: 10 tablet, Rfl: 0   potassium chloride SA (KLOR-CON M) 20 MEQ tablet, Take 2 tablets (40 mEq total) by mouth daily for 3 days. (Patient not taking: Reported on 06/20/2022), Disp: 6 tablet, Rfl: 0  Laboratory examination:   Lab Results  Component Value Date   NA 138 05/26/2022   K 3.2 (L) 05/26/2022   CO2 23 05/26/2022   GLUCOSE 73 05/26/2022   BUN <5 (L) 05/26/2022   CREATININE 0.64 05/26/2022   CALCIUM 9.1 05/26/2022   GFRNONAA >60 05/26/2022       Latest Ref Rng & Units 05/26/2022    5:22 AM 05/25/2022    5:48 AM 05/24/2022    5:41 AM  CMP  Glucose 70 - 99 mg/dL 73  90  76   BUN 6 - 20 mg/dL <5  <5  <5   Creatinine 0.44 - 1.00 mg/dL 0.64  0.71  0.61   Sodium 135 - 145 mmol/L 138  138  136   Potassium 3.5 - 5.1 mmol/L 3.2  3.1  4.2   Chloride 98 - 111 mmol/L 105  105  106   CO2 22 - 32 mmol/L '23  20  22   '$ Calcium 8.9 - 10.3 mg/dL 9.1  8.7  8.6       Latest Ref Rng & Units 05/26/2022    5:22 AM 05/25/2022    5:48 AM 05/24/2022     5:41 AM  CBC  WBC 4.0 - 10.5 K/uL 7.2  6.3  6.1   Hemoglobin 12.0 - 15.0 g/dL 13.0  13.8  13.0   Hematocrit  36.0 - 46.0 % 38.2  40.2  38.8   Platelets 150 - 400 K/uL 258  227  229     Lipid Panel No results for input(s): "CHOL", "TRIG", "LDLCALC", "VLDL", "HDL", "CHOLHDL", "LDLDIRECT" in the last 8760 hours.  HEMOGLOBIN A1C No results found for: "HGBA1C", "MPG" TSH No results for input(s): "TSH" in the last 8760 hours.  External labs:   Labs 04/25/2022: Hemoglobin 14.3 hematocrit 42.3 platelets 277 Glucose 81 BUN 9 creatinine 0.74 potassium 4.2 TSH 0.59 vitamin D level 21.5 Total cholesterol 206 HDL 66 triglycerides 89 LDL 124  Radiology:    Cardiac Studies:   Holter report 12/25/2018 Average heart rate was 85 heart rate ranged from 51-131.  There were 132 SVT episodes lasting a total of 184 minutes.  Repeat event monitor April 2021 showed only 105 PVCs and only 2% of the time she was tachycardic   Echo September 2020 showed normal systolic function with EF 123456 without diastolic dysfunction.  No valvular disease.  No pulmonary hypertension  Nuclear stress test February 02, 2019 showed small anterior and apical ischemia with EF 54%  Cardiac catheterization February 18, 2019 revealed normal coronary arteries  Echocardiogram 05/14/2022: Left ventricle cavity is normal in size and wall thickness. Normal global wall motion. Normal LV systolic function with EF 60%. Normal diastolic filling pattern. Mild tricuspid regurgitation. No evidence of pulmonary hypertension.   Patch Wear Time:  13 days and 16 hours (2024-02-05T12:38:37-0500 to 2024-02-19T05:14:35-0500) Patient had a min HR of 40 bpm, max HR of 130 bpm, and avg HR of 59 bpm. Predominant underlying rhythm was Sinus Rhythm. 3 Supraventricular Tachycardia runs occurred, the run with the fastest interval lasting 12 beats with a max rate of 130 bpm (avg 122 bpm); the run with the fastest interval was also the longest.  Junctional Rhythm was present. Junctional Rhythm was detected within +/- 45 seconds of symptomatic patient event(s). Isolated SVEs were rare (<1.0%), SVE Couplets were rare (<1.0%), and SVE  Triplets were rare (<1.0%). Isolated VEs were rare (<1.0%), and no VE Couplets or VE Triplets were present.   EKG:   05/10/2022: Sinus Bradycardia with LAE. Diffuse low voltage. -Nonspecific T-abnormality.  Assessment     ICD-10-CM   1. Ankylosing spondylitis of multiple sites in spine ([place])  M45.0     2. PSVT (paroxysmal supraventricular tachycardia)  I47.10     3. Nonspecific abnormal electrocardiogram (ECG) (EKG)  R94.31        No orders of the defined types were placed in this encounter.   No orders of the defined types were placed in this encounter.   There are no discontinued medications.    Recommendations:   Amairany M Felt is a 42 y.o.  female with SVT  PSVT (paroxysmal supraventricular tachycardia) Status post SVT ablation April 06, 2019 Has been well-controlled with metoprolol but now more symptomatic Repeat event monitor completed, no significant SVT burden Continue Toprol 100 mg daily   Nonspecific abnormal electrocardiogram (ECG) (EKG) Echocardiogram within normal limits Will repeat annually given her autoimmune disease   Ankylosing spondylitis of multiple sites in spine [facility]) CT scan has confirmed diagnosis while she was in hospital Patient has bilateral iritis and confirmed HLA-B27 Follows with rheumatology     Floydene Flock, DO, [facility]  06/20/2022, 11:58 AM Office: [phone] Pager: [phone]

## 2022-07-11 DIAGNOSIS — H2013 Chronic iridocyclitis, bilateral: Secondary | ICD-10-CM | POA: Diagnosis not present

## 2022-07-11 DIAGNOSIS — Z1589 Genetic susceptibility to other disease: Secondary | ICD-10-CM | POA: Diagnosis not present

## 2022-07-11 DIAGNOSIS — M5440 Lumbago with sciatica, unspecified side: Secondary | ICD-10-CM | POA: Diagnosis not present

## 2022-07-11 DIAGNOSIS — M450 Ankylosing spondylitis of multiple sites in spine: Secondary | ICD-10-CM | POA: Diagnosis not present

## 2022-07-12 DIAGNOSIS — K921 Melena: Secondary | ICD-10-CM | POA: Diagnosis not present

## 2022-07-12 DIAGNOSIS — Z86010 Personal history of colonic polyps: Secondary | ICD-10-CM | POA: Diagnosis not present

## 2022-07-12 DIAGNOSIS — R109 Unspecified abdominal pain: Secondary | ICD-10-CM | POA: Diagnosis not present

## 2022-07-24 ENCOUNTER — Other Ambulatory Visit: Payer: Self-pay

## 2022-07-24 MED ORDER — METOPROLOL SUCCINATE ER 100 MG PO TB24: 100.0000 mg | ORAL_TABLET | Freq: Every day | ORAL | 3 refills | Status: DC

## 2022-07-24 MED ORDER — AMLODIPINE BESYLATE 2.5 MG PO TABS: 2.5000 mg | ORAL_TABLET | Freq: Every day | ORAL | 3 refills | Status: DC

## 2022-08-22 DIAGNOSIS — Z79899 Other long term (current) drug therapy: Secondary | ICD-10-CM | POA: Diagnosis not present

## 2022-08-22 DIAGNOSIS — R41840 Attention and concentration deficit: Secondary | ICD-10-CM | POA: Diagnosis not present

## 2022-08-29 DIAGNOSIS — F338 Other recurrent depressive disorders: Secondary | ICD-10-CM | POA: Diagnosis not present

## 2022-08-29 DIAGNOSIS — R41840 Attention and concentration deficit: Secondary | ICD-10-CM | POA: Diagnosis not present

## 2022-08-29 DIAGNOSIS — F419 Anxiety disorder, unspecified: Secondary | ICD-10-CM | POA: Diagnosis not present

## 2022-10-10 DIAGNOSIS — M5440 Lumbago with sciatica, unspecified side: Secondary | ICD-10-CM | POA: Diagnosis not present

## 2022-10-10 DIAGNOSIS — M450 Ankylosing spondylitis of multiple sites in spine: Secondary | ICD-10-CM | POA: Diagnosis not present

## 2022-10-10 DIAGNOSIS — H2013 Chronic iridocyclitis, bilateral: Secondary | ICD-10-CM | POA: Diagnosis not present

## 2022-10-10 DIAGNOSIS — Z1589 Genetic susceptibility to other disease: Secondary | ICD-10-CM | POA: Diagnosis not present

## 2022-11-01 DIAGNOSIS — E559 Vitamin D deficiency, unspecified: Secondary | ICD-10-CM | POA: Diagnosis not present

## 2022-11-01 DIAGNOSIS — Z0000 Encounter for general adult medical examination without abnormal findings: Secondary | ICD-10-CM | POA: Diagnosis not present

## 2022-11-01 DIAGNOSIS — Z79899 Other long term (current) drug therapy: Secondary | ICD-10-CM | POA: Diagnosis not present

## 2022-11-01 DIAGNOSIS — I10 Essential (primary) hypertension: Secondary | ICD-10-CM | POA: Diagnosis not present

## 2022-11-05 ENCOUNTER — Other Ambulatory Visit: Payer: Self-pay | Admitting: Family Medicine

## 2022-11-05 DIAGNOSIS — M7989 Other specified soft tissue disorders: Secondary | ICD-10-CM

## 2022-11-07 ENCOUNTER — Ambulatory Visit
Admission: RE | Admit: 2022-11-07 | Discharge: 2022-11-07 | Disposition: A | Discharge status: Home / Self Care | Payer: BC Managed Care – PPO | Source: Ambulatory Visit | Attending: Family Medicine | Admitting: Family Medicine

## 2022-11-07 DIAGNOSIS — R221 Localized swelling, mass and lump, neck: Secondary | ICD-10-CM | POA: Diagnosis not present

## 2022-11-07 DIAGNOSIS — M7989 Other specified soft tissue disorders: Secondary | ICD-10-CM

## 2022-11-09 ENCOUNTER — Other Ambulatory Visit: Payer: Self-pay | Admitting: Family Medicine

## 2022-11-09 DIAGNOSIS — K118 Other diseases of salivary glands: Secondary | ICD-10-CM

## 2022-11-13 ENCOUNTER — Encounter (INDEPENDENT_AMBULATORY_CARE_PROVIDER_SITE_OTHER): Payer: Self-pay | Admitting: Otolaryngology

## 2022-11-13 ENCOUNTER — Encounter: Payer: Self-pay | Admitting: General Practice

## 2022-11-13 ENCOUNTER — Ambulatory Visit (INDEPENDENT_AMBULATORY_CARE_PROVIDER_SITE_OTHER): Payer: BC Managed Care – PPO | Admitting: Otolaryngology

## 2022-11-13 VITALS — BP 124/83 | HR 53 | Ht 66.0 in | Wt 184.0 lb

## 2022-11-13 DIAGNOSIS — R070 Pain in throat: Secondary | ICD-10-CM | POA: Diagnosis not present

## 2022-11-13 DIAGNOSIS — K118 Other diseases of salivary glands: Secondary | ICD-10-CM

## 2022-11-13 DIAGNOSIS — R09A2 Foreign body sensation, throat: Secondary | ICD-10-CM

## 2022-11-13 DIAGNOSIS — K219 Gastro-esophageal reflux disease without esophagitis: Secondary | ICD-10-CM | POA: Diagnosis not present

## 2022-11-13 DIAGNOSIS — J342 Deviated nasal septum: Secondary | ICD-10-CM

## 2022-11-13 DIAGNOSIS — R0982 Postnasal drip: Secondary | ICD-10-CM

## 2022-11-13 DIAGNOSIS — J343 Hypertrophy of nasal turbinates: Secondary | ICD-10-CM

## 2022-11-13 DIAGNOSIS — D37030 Neoplasm of uncertain behavior of the parotid salivary glands: Secondary | ICD-10-CM | POA: Diagnosis not present

## 2022-11-13 DIAGNOSIS — J3089 Other allergic rhinitis: Secondary | ICD-10-CM

## 2022-11-13 DIAGNOSIS — R0981 Nasal congestion: Secondary | ICD-10-CM

## 2022-11-13 MED ORDER — FLUTICASONE PROPIONATE 50 MCG/ACT NA SUSP: 2.0000 | Freq: Every day | NASAL | 6 refills | Status: DC

## 2022-11-13 MED ORDER — DESLORATADINE 5 MG PO TABS: 5.0000 mg | ORAL_TABLET | Freq: Every day | ORAL | 3 refills | Status: DC

## 2022-11-13 NOTE — Patient Instructions (Addendum)
-   proceed with CT of the neck and if based on the results and our conversation you need biopsy of the right parotid mass, proceed with the biopsy scheduling  - start Clarinex and Flonase for nasal congestion - start reflux gourmet and diet/lifestyle changes to minimize reflux  - return in 4 weeks to discuss next steps.

## 2022-11-13 NOTE — Progress Notes (Signed)
ENT CONSULT:  Reason for Consult: right parotid mass   HPI: Kayla Gross is an 42 y.o. female with hx of HLA-B27 gene mutation (ankylosing spondylitis and anterior uveitis/iritis) hx of GERD, here for initial evaluation of the right parotid mass and throat discomfort/globus sensation.  She noticed a mass in the area of right parotid gland ~ 12 months ago, and after it became visible when she looks at herself in the mirror, asked her PCP about it 1 month ago. She started to feel that right ear was clogged, and had dry mouth. She is a mouth breather. She denies tingling or paresthesias or facial muscle weakness, difficulties moving her face.  She also has intermittent throat discomfort and sensation of dry mouth/foreign body sensation. he was previously on Protonix for GERD, not any more. She quit smoking in 2015. No other auto-immune conditions. She cannot take NSADs 2/2 some liver disease.   She is on Metoprolol for PVC's and on Imitrex for migraines. She is on muscle relaxer/gabapentin for chronic pain. History of iritis and ankylosing spondylitis - got iritis and was diagnosed with it. She had four times, and then she was seen by PCP to get tested for the gene. She is on Hadlima for it, bi-weekly injections.   Records Reviewed:  D/c summary by Dr Roda Shutters 05/26/22 Admitted for abdominal pain, nausea and RUQ pain, hx of PVCs, seen by Cards, hx of bipolar d/o, chronic low back pain, fibromyalgia     Past Medical History:  Diagnosis Date   Anxiety    Bipolar 1 disorder ([facility])    HX OF ABUSE   Chronic headache    Chronic low back pain    Depression    Dysrhythmia    PVCS   Pectus excavatum    Vitamin D deficiency     Past Surgical History:  Procedure Laterality Date   ABLATION     cardiac for irregular heart rhythm   BIOPSY  05/24/2022   Procedure: BIOPSY;  Surgeon: Willis Modena, MD;  Location: [facility];  Service: Gastroenterology;;   COLONOSCOPY WITH PROPOFOL N/A 05/24/2022    Procedure: COLONOSCOPY WITH PROPOFOL;  Surgeon: Willis Modena, MD;  Location: [facility];  Service: Gastroenterology;  Laterality: N/A;   ESOPHAGOGASTRODUODENOSCOPY (EGD) WITH PROPOFOL N/A 05/24/2022   Procedure: ESOPHAGOGASTRODUODENOSCOPY (EGD) WITH PROPOFOL;  Surgeon: Willis Modena, MD;  Location: [facility];  Service: Gastroenterology;  Laterality: N/A;   HEMOSTASIS CLIP PLACEMENT  05/24/2022   Procedure: HEMOSTASIS CLIP PLACEMENT;  Surgeon: Willis Modena, MD;  Location: [facility];  Service: Gastroenterology;;   HERNIA REPAIR  2021   POLYPECTOMY  05/24/2022   Procedure: POLYPECTOMY;  Surgeon: Willis Modena, MD;  Location: [facility];  Service: Gastroenterology;;   TONSILLECTOMY     VAGINAL HYSTERECTOMY N/A 11/30/2020   Procedure: HYSTERECTOMY TOTAL VAGINAL WITH BILATERAL SALPINGECTOMY.;  Surgeon: Steva Ready, DO;  Location: [facility];  Service: Gynecology;  Laterality: N/A;    Family History  Problem Relation Age of Onset   Diabetes Sister     Social History:  reports that she quit smoking about 3 years ago. Her smoking use included cigarettes. She started smoking about 28 years ago. She has a 6.3 pack-year smoking history. She has never used smokeless tobacco. She reports current alcohol use. She reports that she does not use drugs.  Allergies: No Known Allergies  Medications: I have reviewed the patient's current medications.  The PMH, PSH, Medications, Allergies, and SH were reviewed and updated.  ROS: Constitutional: Negative for fever,  weight loss and weight gain. Cardiovascular: Negative for chest pain and dyspnea on exertion. Respiratory: Is not experiencing shortness of breath at rest. Gastrointestinal: Negative for nausea and vomiting. Neurological: Negative for headaches. Psychiatric: The patient is not nervous/anxious  Blood pressure 124/83, pulse (!) 53, height 5\' 6"  (1.676 m), weight 184 lb (83.5 kg), last menstrual period 11/02/2020, SpO2  97%.  PHYSICAL EXAM:  Exam: General: Well-developed, well-nourished Respiratory Respiratory effort: Equal inspiration and expiration without stridor Cardiovascular Peripheral Vascular: Warm extremities with equal color/perfusion Eyes: No nystagmus with equal extraocular motion bilaterally Neuro/Psych/Balance: Patient oriented to person, place, and time; Appropriate mood and affect; Gait is intact with no imbalance; Cranial nerves I-XII are intact Head and Face Inspection: Normocephalic and atraumatic without mass or lesion Palpation: Facial skeleton intact without bony stepoffs Salivary Glands: Palpable 2 cm pass at the right angle of mandible tail of parotid gland no skin changes, appears mobile and not fixed to underlying tissues  Facial Strength: Facial motility symmetric and full bilaterally ENT Pinna: External ear intact and fully developed External canal: Canal is patent with intact skin Tympanic Membrane: Clear and mobile External Nose: No scar or anatomic deformity Internal Nose: Septum is with septal deviation and narrowing of the left nasal passage on the left. No polyp, or purulence. Mucosal edema and erythema present.  Bilateral inferior turbinate hypertrophy.  Lips, Teeth, and gums: Mucosa and teeth intact and viable TMJ: No pain to palpation with full mobility Oral cavity/oropharynx: No erythema or exudate, no lesions present Nasopharynx: No mass or lesion with intact mucosa Hypopharynx: Intact mucosa without pooling of secretions Larynx Glottic: Full true vocal cord mobility without lesion or mass Supraglottic: Normal appearing epiglottis and AE folds Interarytenoid Space: Moderate inter-arytenoid pachydermia edema Subglottic Space: not seen well  Neck Neck and Trachea: Midline trachea without mass or lesion Thyroid: No mass or nodularity Lymphatics: No lymphadenopathy  Procedure: Preoperative diagnosis:  Postoperative diagnosis:   Same  Procedure: Flexible  fiberoptic laryngoscopy  Surgeon: Ashok Croon, MD  Anesthesia: Topical lidocaine and Afrin Complications: None Condition is stable throughout exam  Indications and consent:  The patient presents to the clinic with Indirect laryngoscopy view was incomplete. Thus it was recommended that they undergo a flexible fiberoptic laryngoscopy. All of the risks, benefits, and potential complications were reviewed with the patient preoperatively and verbal informed consent was obtained.  Procedure: The patient was seated upright in the clinic. Topical lidocaine and Afrin were applied to the nasal cavity. After adequate anesthesia had occurred, I then proceeded to pass the flexible telescope into the nasal cavity. The nasal cavity was patent without rhinorrhea or polyp. The nasopharynx was also patent without mass or lesion. The base of tongue was visualized and was normal. There were no signs of pooling of secretions in the piriform sinuses. The true vocal folds were mobile bilaterally. There were no signs of glottic or supraglottic mucosal lesion or mass. There was moderate interarytenoid pachydermia and post cricoid edema. The telescope was then slowly withdrawn and the patient tolerated the procedure throughout.    Studies Reviewed: Soft tissue U/S of right parotid  FINDINGS: Within the superficial aspect of the right parotid gland is a heterogeneously hypoechoic ovoid mass with mild lobular contours and mild internal vascularity measuring up to proximally 2.3 x 1.3 x 2.3 cm. No evidence of right cervical lymphadenopathy.   IMPRESSION: Indeterminate 2.3 cm right parotid mass. Consider CT neck with contrast or tissue sampling for further characterization.  Assessment/Plan: Encounter Diagnoses  Name  Primary?   Parotid mass Yes   Neoplasm of uncertain behavior of parotid gland    Globus sensation    Gastroesophageal reflux disease without esophagitis    Nasal congestion    Environmental and  seasonal allergies    Post-nasal drip    Throat discomfort    Nasal septal deviation    Hypertrophy of both inferior nasal turbinates    42 year old female with history of recurrent iritis associated with HLA-B27 mutation, currently on biologic medication, history of GERD not on any medications, who is here for initial evaluation about a mass, noted about a year ago, but first evaluated after it became bigger in size about a month ago.  Soft tissue ultrasound with evidence of 2.3 cm right parotid mass, CT neck with contrast is currently pending and scheduled for end of this week.  No facial weakness cranial nerve VII intact.  Normal salivary drainage from Stensen's duct bilaterally.  There is evidence of 2 cm mass in the area of right parotid tail that does not appear to be fixed to underlying soft tissues, and without overlying skin changes.  We discussed the plan to wait for CT neck results and then schedule FNA of the parotid mass as long as it does not appear to be vascular or represent a sialocele.  Will call the patient with radiology results when available, to give her instructions on how to proceed with FNA, which we ordered today. In regards to her throat discomfort, there was no evidence of dry mouth on exam, flexible laryngoscopy did not reveal any masses or tumors or mucosal changes or foreign body.  There was no pooling of secretions in piriforms, laryngeal and hypopharyngeal structures appeared healthy.  There was moderate postcricoid edema and pachydermia, and she had evidence of nasal congestion postnasal drainage most likely related to environmental allergies and postnasal drip.  There was septal deviation on the left side narrowing the nasal passage.  We discussed that the most likely cause of her throat symptoms is globus sensation of untreated GERD LPR versus postnasal drainage irritation.  Will initiate alginate therapy and start the patient on Flonase and antihistamine.   - proceed  with CT of the neck and if based on the results and our conversation on the phone, biopsy of the right parotid mass is warranted, proceed with the biopsy scheduling (FNA ordered today) - start Clarinex and Flonase for nasal congestion - start reflux gourmet and diet/lifestyle changes to minimize reflux  - return in 4 weeks to discuss next steps.    Thank you for allowing me to participate in the care of this patient. Please do not hesitate to contact me with any questions or concerns.   Ashok Croon, MD Otolaryngology [facility] Phone: [phone] Fax: [phone]    11/13/2022, 12:45 PM

## 2022-11-13 NOTE — Progress Notes (Signed)
Kayla Overlie, MD  Caroleen Hamman, NT PROCEDURE / BIOPSY REVIEW Date: 11/13/22  Requested Biopsy site: right neck / parotid lesion Reason for request: needs diagnosis Imaging review: [place]  Decision: Approved Imaging modality to perform: Ultrasound Schedule with: Patient preference (Local vs Mod Sed) Schedule for: Any VIR  Additional comments:   Please contact me with questions, concerns, or if issue pertaining to this request arise.  Arn Medal, MD [facility] Radiology       Previous Messages    ----- Message ----- From: Caroleen Hamman, NT Sent: 11/13/2022  12:13 PM EDT To: Ir Procedure Requests Subject: [place] FNA SALIVARY GLAND/PAROTID GLAND            Procedure: [place] FNA SALIVARY GLAND/PAROTID GLAND  Reason: right parotid mass Dx: Parotid mass [K11.8 (ICD-10-CM)]; Neoplasm of uncertain behavior of parotid gland  History: [place] in chart  Provider: Ashok Croon, MD  Contact: [phone]

## 2022-11-16 ENCOUNTER — Ambulatory Visit
Admission: RE | Admit: 2022-11-16 | Discharge: 2022-11-16 | Disposition: A | Discharge status: Home / Self Care | Payer: BC Managed Care – PPO | Source: Ambulatory Visit | Attending: Family Medicine | Admitting: Family Medicine

## 2022-11-16 DIAGNOSIS — K118 Other diseases of salivary glands: Secondary | ICD-10-CM

## 2022-11-16 DIAGNOSIS — R221 Localized swelling, mass and lump, neck: Secondary | ICD-10-CM | POA: Diagnosis not present

## 2022-11-16 MED ORDER — IOPAMIDOL (ISOVUE-300) INJECTION 61%
75.0000 mL | Freq: Once | INTRAVENOUS | Status: AC | PRN
  Administered 2022-11-16: 75 mL via INTRAVENOUS

## 2022-12-14 ENCOUNTER — Other Ambulatory Visit (INDEPENDENT_AMBULATORY_CARE_PROVIDER_SITE_OTHER): Payer: Self-pay | Admitting: Otolaryngology

## 2022-12-14 ENCOUNTER — Ambulatory Visit (HOSPITAL_COMMUNITY)
Admission: RE | Admit: 2022-12-14 | Discharge: 2022-12-14 | Disposition: A | Discharge status: Home / Self Care | Payer: BC Managed Care – PPO | Source: Ambulatory Visit | Attending: Otolaryngology | Admitting: Otolaryngology

## 2022-12-14 ENCOUNTER — Other Ambulatory Visit: Payer: Self-pay

## 2022-12-14 DIAGNOSIS — J342 Deviated nasal septum: Secondary | ICD-10-CM

## 2022-12-14 DIAGNOSIS — D110 Benign neoplasm of parotid gland: Secondary | ICD-10-CM | POA: Insufficient documentation

## 2022-12-14 DIAGNOSIS — D37030 Neoplasm of uncertain behavior of the parotid salivary glands: Secondary | ICD-10-CM

## 2022-12-14 DIAGNOSIS — J343 Hypertrophy of nasal turbinates: Secondary | ICD-10-CM

## 2022-12-14 DIAGNOSIS — Z87891 Personal history of nicotine dependence: Secondary | ICD-10-CM | POA: Insufficient documentation

## 2022-12-14 DIAGNOSIS — K118 Other diseases of salivary glands: Secondary | ICD-10-CM | POA: Diagnosis not present

## 2022-12-14 DIAGNOSIS — K219 Gastro-esophageal reflux disease without esophagitis: Secondary | ICD-10-CM

## 2022-12-14 DIAGNOSIS — R09A2 Foreign body sensation, throat: Secondary | ICD-10-CM

## 2022-12-14 DIAGNOSIS — R0981 Nasal congestion: Secondary | ICD-10-CM

## 2022-12-14 DIAGNOSIS — R0982 Postnasal drip: Secondary | ICD-10-CM

## 2022-12-14 DIAGNOSIS — J3089 Other allergic rhinitis: Secondary | ICD-10-CM

## 2022-12-14 DIAGNOSIS — R070 Pain in throat: Secondary | ICD-10-CM

## 2022-12-14 MED ORDER — MIDAZOLAM HCL 2 MG/2ML IJ SOLN
INTRAMUSCULAR | Status: AC
  Filled 2022-12-14: qty 4

## 2022-12-14 MED ORDER — MIDAZOLAM HCL 2 MG/2ML IJ SOLN
INTRAMUSCULAR | Status: AC | PRN
  Administered 2022-12-14 (×2): 1 mg via INTRAVENOUS

## 2022-12-14 MED ORDER — FENTANYL CITRATE (PF) 100 MCG/2ML IJ SOLN
INTRAMUSCULAR | Status: AC | PRN
Start: 2022-12-14 — End: 2022-12-14
  Administered 2022-12-14: 50 ug via INTRAVENOUS
  Administered 2022-12-14: 25 ug via INTRAVENOUS

## 2022-12-14 MED ORDER — LIDOCAINE HCL (PF) 1 % IJ SOLN
5.0000 mL | Freq: Once | INTRAMUSCULAR | Status: AC
  Administered 2022-12-14: 5 mL via INTRADERMAL

## 2022-12-14 MED ORDER — FENTANYL CITRATE (PF) 100 MCG/2ML IJ SOLN
INTRAMUSCULAR | Status: AC
  Filled 2022-12-14: qty 4

## 2022-12-14 MED ORDER — HYDROCODONE-ACETAMINOPHEN 5-325 MG PO TABS: 1.0000 | ORAL_TABLET | ORAL | Status: DC | PRN

## 2022-12-14 MED ORDER — SODIUM CHLORIDE 0.9 % IV SOLN: INTRAVENOUS | Status: DC

## 2022-12-14 NOTE — Procedures (Signed)
  Procedure:  [place] core biopsy R parotid mass 18g X4 Preprocedure diagnosis: Diagnoses of Parotid mass and Neoplasm of uncertain behavior of parotid gland were pertinent to this visit. Postprocedure diagnosis: same EBL:    minimal Complications:   none immediate  See full dictation in [employer].  Thora Lance MD Main # [phone] Pager  [phone] Mobile [phone]

## 2022-12-14 NOTE — Sedation Documentation (Signed)
AVS printed and discharge instructions given to pt. Pt verbalized understanding and had no additional questions.

## 2022-12-14 NOTE — Sedation Documentation (Signed)
Pt escorted via wheelchair by hospital transporter once discharged
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
## 2022-12-17 LAB — SURGICAL PATHOLOGY

## 2022-12-19 ENCOUNTER — Ambulatory Visit: Payer: BC Managed Care – PPO | Admitting: Cardiology

## 2022-12-19 ENCOUNTER — Encounter: Payer: Self-pay | Admitting: Cardiology

## 2022-12-19 VITALS — BP 117/72 | HR 67 | Resp 16 | Ht 66.0 in | Wt 187.8 lb

## 2022-12-19 DIAGNOSIS — R002 Palpitations: Secondary | ICD-10-CM | POA: Diagnosis not present

## 2022-12-19 DIAGNOSIS — I471 Supraventricular tachycardia, unspecified: Secondary | ICD-10-CM | POA: Diagnosis not present

## 2022-12-19 MED ORDER — METOPROLOL SUCCINATE ER 100 MG PO TB24: 100.0000 mg | ORAL_TABLET | Freq: Every day | ORAL | 3 refills | Status: DC

## 2022-12-19 NOTE — Progress Notes (Signed)
Follow up visit  Subjective:   Kayla Gross, female    DOB: [birth year], 42 y.o.   MRN: [id number]  HPI  Chief Complaint  Patient presents with   PSVT (paroxysmal supraventricular tachycardia)   Follow-up    6 months    42 y.o.  Caucasian female with hypertension, hyperlipidemia, ankylosing spondylitis, PSVT  Patient is doing well. Palpitations symptoms are controlled with metoprolol. No other symptoms.    Current Outpatient Medications:    Adalimumab-bwwd (HADLIMA) 40 MG/0.4ML SOSY, as directed Subcutaneous every 2 weeks, Disp: , Rfl:    amLODipine (NORVASC) 2.5 MG tablet, Take 1 tablet (2.5 mg total) by mouth daily., Disp: 90 tablet, Rfl: 3   baclofen (LIORESAL) 10 MG tablet, Take 10 mg by mouth 3 (three) times daily., Disp: , Rfl:    desloratadine (CLARINEX) 5 MG tablet, Take 1 tablet (5 mg total) by mouth daily., Disp: 90 tablet, Rfl: 3   fluticasone (FLONASE) 50 MCG/ACT nasal spray, Place 2 sprays into both nostrils daily., Disp: 16 g, Rfl: 6   gabapentin (NEURONTIN) 300 MG capsule, TAKE 1 CAPSULE BY MOUTH AT BEDTIME AS NEEDED (PAIN). (Patient taking differently: Take 300 mg by mouth 3 (three) times daily.), Disp: 30 capsule, Rfl: 5   hydrOXYzine (ATARAX) 25 MG tablet, Take 25 mg by mouth 3 (three) times daily., Disp: , Rfl:    metoprolol succinate (TOPROL-XL) 100 MG 24 hr tablet, Take 1 tablet (100 mg total) by mouth daily., Disp: 90 tablet, Rfl: 3   pantoprazole (PROTONIX) 40 MG tablet, Take 1 tablet (40 mg total) by mouth 2 (two) times daily before a meal., Disp: 60 tablet, Rfl: 0   potassium chloride SA (KLOR-CON M) 20 MEQ tablet, Take 2 tablets (40 mEq total) by mouth daily for 3 days. (Patient not taking: Reported on 06/20/2022), Disp: 6 tablet, Rfl: 0   SUMAtriptan (IMITREX) 50 MG tablet, Take 1 tablet (50 mg total) by mouth every 2 (two) hours as needed for migraine. May repeat in 2 hours if headache persists or recurs., Disp: 10 tablet, Rfl: 0   Cardiovascular & other  pertient studies:  Reviewed external labs and tests, independently interpreted  EKG 12/19/2022: Sinus rhythm 55 bpm Diffuse low voltage. Diffiuse nonspecific T wave abnormality   Echocardiogram 05/14/2022: Left ventricle cavity is normal in size and wall thickness. Normal global wall motion. Normal LV systolic function with EF 60%. Normal diastolic filling pattern. Mild tricuspid regurgitation. No evidence of pulmonary hypertension.   Recent labs: 05/26/2022: Glucose 73, BUN/Cr <5/0.64. EGFR 94. Na/K 138/3.2. Albumin 3.3. Protein 5.5. Rest of the CMP normal H/H 13/38. MCV 92. Platelets 258    Review of Systems  Cardiovascular:  Negative for chest pain, dyspnea on exertion, leg swelling, palpitations and syncope.         Vitals:   12/19/22 1003  BP: 117/72  Pulse: 67  Resp: 16  SpO2: 98%    Body mass index is 30.31 kg/m. Filed Weights   12/19/22 1003  Weight: 187 lb 12.8 oz (85.2 kg)     Objective:   Physical Exam Vitals and nursing note reviewed.  Constitutional:      General: She is not in acute distress. Neck:     Vascular: No JVD.  Cardiovascular:     Rate and Rhythm: Normal rate and regular rhythm.     Heart sounds: Normal heart sounds. No murmur heard. Pulmonary:     Effort: Pulmonary effort is normal.     Breath sounds: Normal  breath sounds. No wheezing or rales.  Musculoskeletal:     Right lower leg: No edema.     Left lower leg: No edema.             Visit diagnoses:   ICD-10-CM   1. PSVT (paroxysmal supraventricular tachycardia)  I47.10 EKG 12-Lead       Orders Placed This Encounter  Procedures   EKG 12-Lead      Meds ordered this encounter  Medications   metoprolol succinate (TOPROL-XL) 100 MG 24 hr tablet    Sig: Take 1 tablet (100 mg total) by mouth daily.    Dispense:  90 tablet    Refill:  3     Assessment & Recommendations:   42 y.o.  Caucasian female with hypertension, hyperlipidemia, ankylosing spondylitis,  PSVT  PSVT: Controlled with metoprolol succinate 100 mg daily.   Hypertension: Blood pressure well controlled.  F/u as needed    Elder Negus, MD Pager: [phone] Office: [phone]

## 2022-12-31 ENCOUNTER — Encounter (INDEPENDENT_AMBULATORY_CARE_PROVIDER_SITE_OTHER): Payer: Self-pay | Admitting: Otolaryngology

## 2023-04-24 ENCOUNTER — Encounter (INDEPENDENT_AMBULATORY_CARE_PROVIDER_SITE_OTHER): Payer: Self-pay | Admitting: Otolaryngology

## 2023-04-24 ENCOUNTER — Telehealth: Payer: Self-pay | Admitting: Cardiology

## 2023-04-24 ENCOUNTER — Ambulatory Visit (INDEPENDENT_AMBULATORY_CARE_PROVIDER_SITE_OTHER): Payer: No Typology Code available for payment source | Admitting: Otolaryngology

## 2023-04-24 VITALS — BP 134/80 | HR 78

## 2023-04-24 DIAGNOSIS — R229 Localized swelling, mass and lump, unspecified: Secondary | ICD-10-CM

## 2023-04-24 DIAGNOSIS — D37030 Neoplasm of uncertain behavior of the parotid salivary glands: Secondary | ICD-10-CM

## 2023-04-24 DIAGNOSIS — D3150 Benign neoplasm of unspecified lacrimal gland and duct: Secondary | ICD-10-CM

## 2023-04-24 DIAGNOSIS — K118 Other diseases of salivary glands: Secondary | ICD-10-CM

## 2023-04-24 NOTE — Telephone Encounter (Signed)
   Pre-operative Risk Assessment    Patient Name: Kayla Gross  DOB: October 24, 1980 MRN: [id number]   Date of last office visit: 12-19-2022 Date of next office visit: none   Request for Surgical Clearance    Procedure:   right parotidectomy  Date of Surgery:  Clearance TBD                                Surgeon:  Dr. Artice Last Surgeon's Group or Practice Name:  [facility] Phone number:  [phone] Fax number:  [phone]   Type of Clearance Requested:   - Medical    Type of Anesthesia:  General    Additional requests/questions:    SignedLacey Pian   04/24/2023, 4:54 PM

## 2023-04-24 NOTE — Progress Notes (Signed)
 ENT Progress Note  Update 04/24/23  Discussed the use of AI scribe software for clinical note transcription with the patient, who gave verbal consent to proceed.  History of Present Illness   The patient with a history of arrhythmia/SVTs and hypertension, presents for a follow-up. Diagnosed with right parotid mass, first noticed approximately two years ago. They report that the mass has slightly increased in size. The patient experiences a cooling, tingling sensation upon touching the mass. They also report the development of a facial twitch, specifically in the eyebrow region, which occurs sporadically. The patient also notes a sensation of pressure in the area of the mass, even when not touching it. No facial weakness. Core biopsy (+) pleomorphic adenoma.   In addition to the facial mass, the patient has been managing arrhythmia, specifically premature ventricular contractions (PVCs), under the care of a cardiologist. They are currently on metoprolol  for rate control and amlodipine  for hypertension. The patient has not reported any new symptoms related to these conditions.     Initial Evaluation 11/15/22 Reason for Consult: right parotid mass   HPI: Kayla Gross is an 43 y.o. female with hx of HLA-B27 gene mutation (ankylosing spondylitis and anterior uveitis/iritis) hx of GERD, here for initial evaluation of the right parotid mass and throat discomfort/globus sensation.  She noticed a mass in the area of right parotid gland ~ 12 months ago, and after it became visible when she looks at herself in the mirror, asked her PCP about it 1 month ago. She started to feel that right ear was clogged, and had dry mouth. She is a mouth breather. She denies tingling or paresthesias or facial muscle weakness, difficulties moving her face.  She also has intermittent throat discomfort and sensation of dry mouth/foreign body sensation. he was previously on Protonix  for GERD, not any more. She quit smoking in 2015.  No other auto-immune conditions. She cannot take NSADs 2/2 some liver disease.   She is on Metoprolol  for PVC's and on Imitrex  for migraines. She is on muscle relaxer/gabapentin  for chronic pain. History of iritis and ankylosing spondylitis - got iritis and was diagnosed with it. She had four times, and then she was seen by PCP to get tested for the gene. She is on Hadlima for it, bi-weekly injections.   Records Reviewed:  D/c summary by Dr Christiane Cowing 05/26/22 Admitted for abdominal pain, nausea and RUQ pain, hx of PVCs, seen by Cards, hx of bipolar d/o, chronic low back pain, fibromyalgia     Past Medical History:  Diagnosis Date   Anxiety    Bipolar 1 disorder (HCC)    HX OF ABUSE   Chronic headache    Chronic low back pain    Depression    Dysrhythmia    PVCS   Pectus excavatum    Vitamin D deficiency     Past Surgical History:  Procedure Laterality Date   ABLATION     cardiac for irregular heart rhythm   BIOPSY  05/24/2022   Procedure: BIOPSY;  Surgeon: Evangeline Hilts, MD;  Location: [facility];  Service: Gastroenterology;;   COLONOSCOPY WITH PROPOFOL  N/A 05/24/2022   Procedure: COLONOSCOPY WITH PROPOFOL ;  Surgeon: Evangeline Hilts, MD;  Location: [facility];  Service: Gastroenterology;  Laterality: N/A;   ESOPHAGOGASTRODUODENOSCOPY (EGD) WITH PROPOFOL  N/A 05/24/2022   Procedure: ESOPHAGOGASTRODUODENOSCOPY (EGD) WITH PROPOFOL ;  Surgeon: Evangeline Hilts, MD;  Location: [facility];  Service: Gastroenterology;  Laterality: N/A;   HEMOSTASIS CLIP PLACEMENT  05/24/2022   Procedure: HEMOSTASIS CLIP  PLACEMENT;  Surgeon: Evangeline Hilts, MD;  Location: [facility];  Service: Gastroenterology;;   HERNIA REPAIR  2021   POLYPECTOMY  05/24/2022   Procedure: POLYPECTOMY;  Surgeon: Evangeline Hilts, MD;  Location: [facility];  Service: Gastroenterology;;   TONSILLECTOMY     VAGINAL HYSTERECTOMY N/A 11/30/2020   Procedure: HYSTERECTOMY TOTAL VAGINAL WITH BILATERAL SALPINGECTOMY.;  Surgeon:  Meldon Sport, DO;  Location: [facility];  Service: Gynecology;  Laterality: N/A;    Family History  Problem Relation Age of Onset   Diabetes Sister     Social History:  reports that she quit smoking about 3 years ago. Her smoking use included cigarettes. She started smoking about 29 years ago. She has a 6.3 pack-year smoking history. She has never used smokeless tobacco. She reports current alcohol use. She reports that she does not use drugs.  Allergies: No Known Allergies  Medications: I have reviewed the patient's current medications.  The PMH, PSH, Medications, Allergies, and SH were reviewed and updated.  ROS: Constitutional: Negative for fever, weight loss and weight gain. Cardiovascular: Negative for chest pain and dyspnea on exertion. Respiratory: Is not experiencing shortness of breath at rest. Gastrointestinal: Negative for nausea and vomiting. Neurological: Negative for headaches. Psychiatric: The patient is not nervous/anxious  Blood pressure 134/80, pulse 78, last menstrual period 11/02/2020, SpO2 99%.  PHYSICAL EXAM:  Exam: General: Well-developed, well-nourished Respiratory Respiratory effort: Equal inspiration and expiration without stridor Cardiovascular Peripheral Vascular: Warm extremities with equal color/perfusion Eyes: No nystagmus with equal extraocular motion bilaterally Neuro/Psych/Balance: Patient oriented to person, place, and time; Appropriate mood and affect; Gait is intact with no imbalance; Cranial nerves I-XII are intact Head and Face Inspection: Normocephalic and atraumatic without mass or lesion Palpation: Facial skeleton intact without bony stepoffs Salivary Glands: Previously noted palpable 2 cm pass at the right angle of mandible tail of parotid gland no skin changes, appears mobile and not fixed to underlying tissues  Facial Strength: Facial motility symmetric and full bilaterally ENT Pinna: External ear intact and fully  developed External canal: Canal is patent with intact skin Lips, Teeth, and gums: Mucosa and teeth intact and viable TMJ: No pain to palpation with full mobility Neck Neck and Trachea: Midline trachea without mass or lesion Thyroid : No mass or nodularity Lymphatics: No lymphadenopathy  Studies Reviewed: Soft tissue U/S of right parotid  FINDINGS: Within the superficial aspect of the right parotid gland is a heterogeneously hypoechoic ovoid mass with mild lobular contours and mild internal vascularity measuring up to proximally 2.3 x 1.3 x 2.3 cm. No evidence of right cervical lymphadenopathy.   IMPRESSION: Indeterminate 2.3 cm right parotid mass. Consider CT neck with contrast or tissue sampling for further characterization.  CT neck soft tissue 11/16/22 IMPRESSION: 1. Ill-defined area of hyperenhancement in the right parotid gland, possibly correlating with the previously described 2.3 cm right parotid mass on prior ultrasound, but more clearly seen on that study. Consider ultrasound guided tissue sampling. 2. No suspicious cervical lymphadenopathy. 3. Multilevel cervical spondylosis, worst at C5-6 and C6-7, where there is at least mild spinal canal stenosis.     Core biopsy results 12/14/22 FINAL MICROSCOPIC DIAGNOSIS:   A. PAROTID MASS, RIGHT, NEEDLE CORE BIOPSY:  - Benign pleomorphic adenoma   Assessment/Plan: Encounter Diagnoses  Name Primary?   Parotid mass    Neoplasm of uncertain behavior of parotid gland Yes    43 year old female with history of recurrent iritis associated with HLA-B27 mutation, currently on biologic medication, history of GERD not on  any medications, who is here for initial evaluation about a mass, noted about a year ago, but first evaluated after it became bigger in size about a month ago.  Soft tissue ultrasound with evidence of 2.3 cm right parotid mass, CT neck with contrast is currently pending and scheduled for end of this week.  No facial  weakness cranial nerve VII intact.  Normal salivary drainage from Stensen's duct bilaterally.  There is evidence of 2 cm mass in the area of right parotid tail that does not appear to be fixed to underlying soft tissues, and without overlying skin changes.  We discussed the plan to wait for CT neck results and then schedule FNA of the parotid mass as long as it does not appear to be vascular or represent a sialocele.  Will call the patient with radiology results when available, to give her instructions on how to proceed with FNA, which we ordered today. In regards to her throat discomfort, there was no evidence of dry mouth on exam, flexible laryngoscopy did not reveal any masses or tumors or mucosal changes or foreign body.  There was no pooling of secretions in piriforms, laryngeal and hypopharyngeal structures appeared healthy.  There was moderate postcricoid edema and pachydermia, and she had evidence of nasal congestion postnasal drainage most likely related to environmental allergies and postnasal drip.  There was septal deviation on the left side narrowing the nasal passage.  We discussed that the most likely cause of her throat symptoms is globus sensation of untreated GERD LPR versus postnasal drainage irritation.  Will initiate alginate therapy and start the patient on Flonase  and antihistamine.   - proceed with CT of the neck and if based on the results and our conversation on the phone, biopsy of the right parotid mass is warranted, proceed with the biopsy scheduling (FNA ordered today) - start Clarinex  and Flonase  for nasal congestion - start reflux gourmet and diet/lifestyle changes to minimize reflux  - return in 4 weeks to discuss next steps.    Update 04/24/23 Assessment and Plan    Right parotid mass, Core biopsy c/w Pleomorphic Adenoma Pleomorphic adenoma of the parotid gland confirmed by biopsy. The mass has increased in size over the past two years, now more visible.  CT scan showed  an ill-defined mass; MRI recommended for better soft tissue definition and for surgical planning. Discussed benign nature but emphasized removal due to potential growth and future surgical complexity if excision delayed. Explained a need to confirm final pathology after excision. Explained risks of facial nerve injury. Noted 80% of parotid tumors are benign, 20% are not. Discussed potential need for radiation if malignant. - Order MRI neck w/contrast for tumor assessment/surgical planning - Schedule for right superficial parotidectomy - Obtain cardiology clearance for surgery due to arrhythmia/SVT  I spent 30 minutes in total face-to-face time and in reviewing records during pre-charting, more than 50% of which was spent in counseling and coordination of care, reviewing test results, reviewing medications and treatment regimen and/or in discussing or reviewing the diagnosis, the prognosis and treatment options. Pertinent laboratory and imaging test results that were available during this visit with the patient were reviewed by me and considered in my medical decision making (see chart for details).    Artice Last, MD Otolaryngology [facility] Phone: [phone] Fax: [phone]    04/24/2023, 7:33 PM

## 2023-04-26 NOTE — Telephone Encounter (Signed)
 1st attempt to reach pt regarding surgical clearance and the need for an TELE appointment.  Left pt a detailed message to call back and get that scheduled.

## 2023-04-26 NOTE — Telephone Encounter (Signed)
   Name: Kayla Gross  DOB: 1980-09-13  MRN: [id number]  Primary Cardiologist: Little Ishikawa, MD   Preoperative team, please contact this patient and set up a phone call appointment for further preoperative risk assessment. Please obtain consent and complete medication review. Thank you for your help.  I confirm that guidance regarding antiplatelet and oral anticoagulation therapy has been completed and, if necessary, noted below.  None  I also confirmed the patient resides in the state of [place]. As per [facility] telemedicine laws, the patient must reside in the state in which the provider is licensed.   Napoleon Form, Leodis Rains, NP 04/26/2023, 7:17 AM [facility]

## 2023-04-29 ENCOUNTER — Telehealth: Payer: Self-pay

## 2023-04-29 NOTE — Telephone Encounter (Signed)
Called patient to set up an Tele appt on 05/24/23 for a pre-op, meds rec and consent done    Patient Consent for Virtual Visit        Kayla Gross has provided verbal consent on 04/29/2023 for a virtual visit (video or telephone).   CONSENT FOR VIRTUAL VISIT FOR:  Kayla Gross  By participating in this virtual visit I agree to the following:  I hereby voluntarily request, consent and authorize [facility] and its employed or contracted physicians, physician assistants, nurse practitioners or other licensed health care professionals (the Practitioner), to provide me with telemedicine health care services (the "Services") as deemed necessary by the treating Practitioner. I acknowledge and consent to receive the Services by the Practitioner via telemedicine. I understand that the telemedicine visit will involve communicating with the Practitioner through live audiovisual communication technology and the disclosure of certain medical information by electronic transmission. I acknowledge that I have been given the opportunity to request an in-person assessment or other available alternative prior to the telemedicine visit and am voluntarily participating in the telemedicine visit.  I understand that I have the right to withhold or withdraw my consent to the use of telemedicine in the course of my care at any time, without affecting my right to future care or treatment, and that the Practitioner or I may terminate the telemedicine visit at any time. I understand that I have the right to inspect all information obtained and/or recorded in the course of the telemedicine visit and may receive copies of available information for a reasonable fee.  I understand that some of the potential risks of receiving the Services via telemedicine include:  Delay or interruption in medical evaluation due to technological equipment failure or disruption; Information transmitted may not be sufficient (e.g.  poor resolution of images) to allow for appropriate medical decision making by the Practitioner; and/or  In rare instances, security protocols could fail, causing a breach of personal health information.  Furthermore, I acknowledge that it is my responsibility to provide information about my medical history, conditions and care that is complete and accurate to the best of my ability. I acknowledge that Practitioner's advice, recommendations, and/or decision may be based on factors not within their control, such as incomplete or inaccurate data provided by me or distortions of diagnostic images or specimens that may result from electronic transmissions. I understand that the practice of medicine is not an exact science and that Practitioner makes no warranties or guarantees regarding treatment outcomes. I acknowledge that a copy of this consent can be made available to me via my patient portal [facility] MyChart), or I can request a printed copy by calling the office of [facility].    I understand that my insurance will be billed for this visit.   I have read or had this consent read to me. I understand the contents of this consent, which adequately explains the benefits and risks of the Services being provided via telemedicine.  I have been provided ample opportunity to ask questions regarding this consent and the Services and have had my questions answered to my satisfaction. I give my informed consent for the services to be provided through the use of telemedicine in my medical care

## 2023-04-29 NOTE — Telephone Encounter (Signed)
Called patient to set up an Tele appt on 05/24/23 for a pre-op, meds rec and consent done

## 2023-05-09 ENCOUNTER — Ambulatory Visit (HOSPITAL_COMMUNITY)
Admission: RE | Admit: 2023-05-09 | Discharge: 2023-05-09 | Disposition: A | Discharge status: Home / Self Care | Payer: No Typology Code available for payment source | Source: Ambulatory Visit | Attending: Otolaryngology | Admitting: Otolaryngology

## 2023-05-09 DIAGNOSIS — D37030 Neoplasm of uncertain behavior of the parotid salivary glands: Secondary | ICD-10-CM | POA: Diagnosis present

## 2023-05-09 DIAGNOSIS — K118 Other diseases of salivary glands: Secondary | ICD-10-CM | POA: Diagnosis present

## 2023-05-09 MED ORDER — GADOBUTROL 1 MMOL/ML IV SOLN
7.5000 mL | Freq: Once | INTRAVENOUS | Status: AC | PRN
  Administered 2023-05-09: 7.5 mL via INTRAVENOUS

## 2023-05-24 ENCOUNTER — Ambulatory Visit: Payer: No Typology Code available for payment source | Attending: Physician Assistant

## 2023-05-24 DIAGNOSIS — Z01810 Encounter for preprocedural cardiovascular examination: Secondary | ICD-10-CM

## 2023-05-24 NOTE — Progress Notes (Signed)
Virtual Visit via Telephone Note   Because of Kayla Gross co-morbid illnesses, she is at least at moderate risk for complications without adequate follow up.  This format is felt to be most appropriate for this patient at this time.  Due to technical limitations with video connection (technology), today's appointment will be conducted as an audio only telehealth visit, and Kayla Gross verbally agreed to proceed in this manner.   All issues noted in this document were discussed and addressed.  No physical exam could be performed with this format.  Evaluation Performed:  Preoperative cardiovascular risk assessment _____________   Date:  05/24/2023   Patient ID:  Kayla Gross, Kayla Gross 1980-06-11, MRN [id number] Patient Location:  Home Provider location:   Office  Primary Care Provider:  Mila Palmer, MD Primary Cardiologist:  Little Ishikawa, MD  Chief Complaint / Patient Profile   43 y.o. y/o female with a h/o hypertension, hyperlipidemia, PSVT and ankylosing spondylitis who is pending right parotidectomy and presents today for telephonic preoperative cardiovascular risk assessment.  History of Present Illness    Kayla Gross is a 43 y.o. female who presents via audio/video conferencing for a telehealth visit today.  Pt was last seen in cardiology clinic on 12/19/2022 by Dr. Rosemary Holms.  At that time Kayla Gross was doing well .  The patient is now pending procedure as outlined above. Since her last visit, she tells me that everything has been normal without any increase in symptoms.  Does struggle with an occasional dizziness and lightheadedness when climbing stairs and in elevators. However this is her baseline. She has been more active with yoga and walking but limited by her AS. She is starting some beginners pilates to work on flexibility and range of motion.  She also mentioned switching to a female provider for her cardiologist.   No medications indicated as  needing held.   Past Medical History    Past Medical History:  Diagnosis Date   Anxiety    Bipolar 1 disorder (HCC)    HX OF ABUSE   Chronic headache    Chronic low back pain    Depression    Dysrhythmia    PVCS   Pectus excavatum    Vitamin D deficiency    Past Surgical History:  Procedure Laterality Date   ABLATION     cardiac for irregular heart rhythm   BIOPSY  05/24/2022   Procedure: BIOPSY;  Surgeon: Willis Modena, MD;  Location: [facility];  Service: Gastroenterology;;   COLONOSCOPY WITH PROPOFOL N/A 05/24/2022   Procedure: COLONOSCOPY WITH PROPOFOL;  Surgeon: Willis Modena, MD;  Location: [facility];  Service: Gastroenterology;  Laterality: N/A;   ESOPHAGOGASTRODUODENOSCOPY (EGD) WITH PROPOFOL N/A 05/24/2022   Procedure: ESOPHAGOGASTRODUODENOSCOPY (EGD) WITH PROPOFOL;  Surgeon: Willis Modena, MD;  Location: [facility];  Service: Gastroenterology;  Laterality: N/A;   HEMOSTASIS CLIP PLACEMENT  05/24/2022   Procedure: HEMOSTASIS CLIP PLACEMENT;  Surgeon: Willis Modena, MD;  Location: [facility];  Service: Gastroenterology;;   HERNIA REPAIR  2021   POLYPECTOMY  05/24/2022   Procedure: POLYPECTOMY;  Surgeon: Willis Modena, MD;  Location: [facility];  Service: Gastroenterology;;   TONSILLECTOMY     VAGINAL HYSTERECTOMY N/A 11/30/2020   Procedure: HYSTERECTOMY TOTAL VAGINAL WITH BILATERAL SALPINGECTOMY.;  Surgeon: Steva Ready, DO;  Location: [facility];  Service: Gynecology;  Laterality: N/A;    Allergies  No Known Allergies  Home Medications    Prior to Admission medications   Medication Sig  Start Date End Date Taking? Authorizing Provider  Adalimumab-bwwd (HADLIMA) 40 MG/0.4ML SOSY as directed Subcutaneous every 2 weeks    [provider]  amLODipine (NORVASC) 2.5 MG tablet Take 1 tablet (2.5 mg total) by mouth daily. 07/24/22   Custovic, Rozell Searing, DO  baclofen (LIORESAL) 10 MG tablet Take 10 mg by mouth 3 (three) times daily.    [provider]  gabapentin (NEURONTIN) 300 MG capsule TAKE 1 CAPSULE BY MOUTH AT BEDTIME AS NEEDED (PAIN). Patient taking differently: Take 300 mg by mouth 3 (three) times daily. 07/19/21   Kathryne Hitch, MD  hydrOXYzine (ATARAX) 25 MG tablet Take 25 mg by mouth 3 (three) times daily. 05/08/22   [provider]  metoprolol succinate (TOPROL-XL) 100 MG 24 hr tablet Take 1 tablet (100 mg total) by mouth daily. 12/19/22   Patwardhan, Anabel Bene, MD  pantoprazole (PROTONIX) 40 MG tablet Take 40 mg by mouth daily. Patient not taking: Reported on 04/29/2023 12/31/22   [provider]  SUMAtriptan (IMITREX) 50 MG tablet Take 1 tablet (50 mg total) by mouth every 2 (two) hours as needed for migraine. May repeat in 2 hours if headache persists or recurs. 09/10/21   Theadora Rama Scales, PA-C    Physical Exam    Vital Signs:  Kayla Gross does not have vital signs available for review today.  Given telephonic nature of communication, physical exam is limited. AAOx3. NAD. Normal affect.  Speech and respirations are unlabored.  Accessory Clinical Findings    None  Assessment & Plan    1.  Preoperative Cardiovascular Risk Assessment:  Kayla Gross's perioperative risk of a major cardiac event is 0.4% according to the Revised Cardiac Risk Index (RCRI).  Therefore, she is at low risk for perioperative complications.   Her functional capacity is good at 5.07 METs according to the Duke Activity Status Index (DASI). Recommendations: According to ACC/AHA guidelines, no further cardiovascular testing needed.  The patient may proceed to surgery at acceptable risk.     A copy of this note will be routed to requesting surgeon.  Time:   Today, I have spent 7 minutes with the patient with telehealth technology discussing medical history, symptoms, and management plan.     Sharlene Dory, PA-C  05/24/2023, 9:54 AM

## 2023-05-29 NOTE — Pre-Procedure Instructions (Signed)
 Surgical Instructions   Your procedure is scheduled on Wednesday, February 26th. Report to [facility] Main Entrance "A" at 05:30 A.M., then check in with the Admitting office. Any questions or running late day of surgery: call [phone]  Questions prior to your surgery date: call [phone], Monday-Friday, 8am-4pm. If you experience any cold or flu symptoms such as cough, fever, chills, shortness of breath, etc. between now and your scheduled surgery, please notify us at the above number.     Remember:  Do not eat or drink after midnight the night before your surgery     Take these medicines the morning of surgery with A SIP OF WATER  amLODipine (NORVASC)  gabapentin (NEURONTIN)  hydrOXYzine (ATARAX)  metoprolol succinate (TOPROL-XL)    May take these medicines IF NEEDED: baclofen (LIORESAL)  SUMAtriptan (IMITREX)    One week prior to surgery, STOP taking any Aspirin (unless otherwise instructed by your surgeon) Aleve, Naproxen, Ibuprofen, Motrin, Advil, Goody's, BC's, all herbal medications, fish oil, and non-prescription vitamins.                     Do NOT Smoke (Tobacco/Vaping) for 24 hours prior to your procedure.  If you use a CPAP at night, you may bring your mask/headgear for your overnight stay.   You will be asked to remove any contacts, glasses, piercing's, hearing aid's, dentures/partials prior to surgery. Please bring cases for these items if needed.    Patients discharged the day of surgery will not be allowed to drive home, and someone needs to stay with them for 24 hours.  SURGICAL WAITING ROOM VISITATION Patients may have no more than 2 support people in the waiting area - these visitors may rotate.   Pre-op nurse will coordinate an appropriate time for 1 ADULT support person, who may not rotate, to accompany patient in pre-op.  Children under the age of 88 must have an adult with them who is not the patient and must remain in the main waiting area with  an adult.  If the patient needs to stay at the hospital during part of their recovery, the visitor guidelines for inpatient rooms apply.  Please refer to the [facility] website for the visitor guidelines for any additional information.   If you received a COVID test during your pre-op visit  it is requested that you wear a mask when out in public, stay away from anyone that may not be feeling well and notify your surgeon if you develop symptoms. If you have been in contact with anyone that has tested positive in the last 10 days please notify you surgeon.      Pre-operative CHG Bathing Instructions   You can play a key role in reducing the risk of infection after surgery. Your skin needs to be as free of germs as possible. You can reduce the number of germs on your skin by washing with CHG (chlorhexidine gluconate) soap before surgery. CHG is an antiseptic soap that kills germs and continues to kill germs even after washing.   DO NOT use if you have an allergy to chlorhexidine/CHG or antibacterial soaps. If your skin becomes reddened or irritated, stop using the CHG and notify one of our RNs at [phone].              TAKE A SHOWER THE NIGHT BEFORE SURGERY AND THE DAY OF SURGERY    Please keep in mind the following:  DO NOT shave, including legs and underarms, 48  hours prior to surgery.   You may shave your face before/day of surgery.  Place clean sheets on your bed the night before surgery Use a clean washcloth (not used since being washed) for each shower. DO NOT sleep with pet's night before surgery.  CHG Shower Instructions:  Wash your face and private area with normal soap. If you choose to wash your hair, wash first with your normal shampoo.  After you use shampoo/soap, rinse your hair and body thoroughly to remove shampoo/soap residue.  Turn the water OFF and apply half the bottle of CHG soap to a CLEAN washcloth.  Apply CHG soap ONLY FROM YOUR NECK DOWN TO YOUR TOES  (washing for 3-5 minutes)  DO NOT use CHG soap on face, private areas, open wounds, or sores.  Pay special attention to the area where your surgery is being performed.  If you are having back surgery, having someone wash your back for you may be helpful. Wait 2 minutes after CHG soap is applied, then you may rinse off the CHG soap.  Pat dry with a clean towel  Put on clean pajamas    Additional instructions for the day of surgery: DO NOT APPLY any lotions, deodorants, cologne, or perfumes.   Do not wear jewelry or makeup Do not wear nail polish, gel polish, artificial nails, or any other type of covering on natural nails (fingers and toes) Do not bring valuables to the hospital. [facility] is not responsible for valuables/personal belongings. Put on clean/comfortable clothes.  Please brush your teeth.  Ask your nurse before applying any prescription medications to the skin.

## 2023-05-31 ENCOUNTER — Encounter (HOSPITAL_COMMUNITY): Payer: Self-pay

## 2023-05-31 ENCOUNTER — Encounter (HOSPITAL_COMMUNITY)
Admission: RE | Admit: 2023-05-31 | Discharge: 2023-05-31 | Disposition: A | Discharge status: Home / Self Care | Payer: No Typology Code available for payment source | Source: Ambulatory Visit | Attending: Otolaryngology | Admitting: Otolaryngology

## 2023-05-31 ENCOUNTER — Other Ambulatory Visit: Payer: Self-pay

## 2023-05-31 VITALS — BP 112/71 | HR 51 | Temp 98.2°F | Resp 17 | Ht 66.0 in | Wt 195.2 lb

## 2023-05-31 DIAGNOSIS — R0789 Other chest pain: Secondary | ICD-10-CM | POA: Insufficient documentation

## 2023-05-31 DIAGNOSIS — Z01812 Encounter for preprocedural laboratory examination: Secondary | ICD-10-CM | POA: Insufficient documentation

## 2023-05-31 DIAGNOSIS — I071 Rheumatic tricuspid insufficiency: Secondary | ICD-10-CM | POA: Diagnosis not present

## 2023-05-31 DIAGNOSIS — Z79899 Other long term (current) drug therapy: Secondary | ICD-10-CM | POA: Diagnosis not present

## 2023-05-31 DIAGNOSIS — I471 Supraventricular tachycardia, unspecified: Secondary | ICD-10-CM | POA: Insufficient documentation

## 2023-05-31 DIAGNOSIS — E785 Hyperlipidemia, unspecified: Secondary | ICD-10-CM | POA: Diagnosis not present

## 2023-05-31 DIAGNOSIS — I10 Essential (primary) hypertension: Secondary | ICD-10-CM | POA: Insufficient documentation

## 2023-05-31 DIAGNOSIS — Z01818 Encounter for other preprocedural examination: Secondary | ICD-10-CM

## 2023-05-31 HISTORY — DX: Essential (primary) hypertension: I10

## 2023-05-31 HISTORY — DX: Ankylosing spondylitis of unspecified sites in spine: M45.9

## 2023-05-31 HISTORY — DX: Obsessive-compulsive disorder, unspecified: F42.9

## 2023-05-31 HISTORY — DX: Umbilical hernia without obstruction or gangrene: K42.9

## 2023-05-31 HISTORY — DX: Fibromyalgia: M79.7

## 2023-05-31 LAB — CBC
HCT: 40.9 % (ref 36.0–46.0)
Hemoglobin: 14.2 g/dL (ref 12.0–15.0)
MCH: 30.6 pg (ref 26.0–34.0)
MCHC: 34.7 g/dL (ref 30.0–36.0)
MCV: 88.1 fL (ref 80.0–100.0)
Platelets: 305 10*3/uL (ref 150–400)
RBC: 4.64 MIL/uL (ref 3.87–5.11)
RDW: 12.6 % (ref 11.5–15.5)
WBC: 5.9 10*3/uL (ref 4.0–10.5)
nRBC: 0 % (ref 0.0–0.2)

## 2023-05-31 LAB — BASIC METABOLIC PANEL
Anion gap: 10 (ref 5–15)
BUN: 9 mg/dL (ref 6–20)
CO2: 25 mmol/L (ref 22–32)
Calcium: 9.5 mg/dL (ref 8.9–10.3)
Chloride: 103 mmol/L (ref 98–111)
Creatinine, Ser: 0.9 mg/dL (ref 0.44–1.00)
GFR, Estimated: >60 mL/min (ref >60)
Glucose, Bld: 95 mg/dL (ref 70–99)
Potassium: 4.4 mmol/L (ref 3.5–5.1)
Sodium: 138 mmol/L (ref 135–145)

## 2023-05-31 NOTE — Progress Notes (Signed)
 Anesthesia Chart Review:  43 yo female follows with cardiology for hx of HTN, HLD, frequent PVCs s/p ablation 03/2019 at [facility], PSVT - maintained on Toprol XL 100mg  daily. Cath 2020 for eval of chest pain showed normal coronaries. Echo 05/2022 showed EF 60%, no significant valvular abnormalities. Seen by Jari Favre, PA-C on 05/24/23 for preop eval. Per note, "Ms. Harkleroad's perioperative risk of a major cardiac event is 0.4% according to the Revised Cardiac Risk Index (RCRI).  Therefore, she is at low risk for perioperative complications.   Her functional capacity is good at 5.07 METs according to the Duke Activity Status Index (DASI). Recommendations: According to ACC/AHA guidelines, no further cardiovascular testing needed.  The patient may proceed to surgery at acceptable risk."  Other pertinent hx includes HLA-B27 gene mutation (ankylosing spondylitis and anterior uveitis/iritis), GERD, migraines.  Hx of difficult intubation. Per anesthesia intubation note 11/30/20, "Difficulty Due To: Difficult Airway- due to anterior larynx Comments: Smooth IV induction. Easy mask. DL x 1 CRNA. Mil 3. Grade 4 view. Patient's mouth and airway narrow. Anterior larynx. DL x 1 CRNA Video-glide. Grade 1. Atraumatic oral intubation. +ETCO2 BBSE."  Proep labs reviewed, WNL.  EKG 12/19/22: Sinus brady. 55 bpm. Diffuse low voltage. Diffiuse nonspecific T wave abnormality  14d event monitor 06/07/22: Patient had a min HR of 40 bpm, max HR of 130 bpm, and avg HR of 59 bpm. Predominant underlying rhythm was Sinus Rhythm. 3 Supraventricular Tachycardia runs occurred, the run with the fastest interval lasting 12 beats with a max rate of 130 bpm (avg 122 bpm); the run with the fastest interval was also the longest. Junctional Rhythm was present. Junctional Rhythm was detected within +/- 45 seconds of symptomatic patient event(s). Isolated SVEs were rare (<1.0%), SVE Couplets were rare (<1.0%), and SVE  Triplets were rare (<1.0%).  Isolated VEs were rare (<1.0%), and no VE Couplets or VE Triplets were present.  Echocardiogram 05/14/2022: Left ventricle cavity is normal in size and wall thickness. Normal global wall motion. Normal LV systolic function with EF 60%. Normal diastolic filling pattern. Mild tricuspid regurgitation. No evidence of pulmonary hypertension  Cath 02/18/2019 (care everywhere): Conclusion:  1. Normal coronary arteries  2. Normal LV function and pressure  3. Noncardiac chest pain   Plan:  1. Low up with PCP for evaluation of noncardiac chest pain and  palpitations that are noncardiac    [facility] Short Stay Center/Anesthesiology Phone [phone] 05/31/2023 2:59 PM

## 2023-05-31 NOTE — Anesthesia Preprocedure Evaluation (Addendum)
 Anesthesia Evaluation  Patient identified by MRN, date of birth, ID band Patient awake    Reviewed: Allergy & Precautions, NPO status , Patient's Chart, lab work & pertinent test results, reviewed documented beta blocker date and time   History of Anesthesia Complications Negative for: history of anesthetic complications  Airway Mallampati: II  TM Distance: >3 FB Neck ROM: Full    Dental  (+) Dental Advisory Given, Poor Dentition, Missing, Chipped   Pulmonary Patient abstained from smoking., former smoker   breath sounds clear to auscultation       Cardiovascular hypertension, Pt. on medications and Pt. on home beta blockers (-) angina (-) CAD + dysrhythmias (PVc's, now s/p ablation)  Rhythm:Regular Rate:Normal  '24 ECHO: EF 60%, no significant valvular abnormalities   Neuro/Psych  Headaches  Anxiety Depression Bipolar Disorder      GI/Hepatic negative GI ROS, Neg liver ROS,,,  Endo/Other  BMI 31  Renal/GU negative Renal ROS     Musculoskeletal  (+) Arthritis ,  Fibromyalgia -  Abdominal   Peds  Hematology negative hematology ROS (+)   Anesthesia Other Findings   Reproductive/Obstetrics LMP 2022                             Anesthesia Physical Anesthesia Plan  ASA: 2  Anesthesia Plan: General   Post-op Pain Management: Tylenol PO (pre-op)*   Induction: Intravenous  PONV Risk Score and Plan: Dexamethasone, Ondansetron and Scopolamine patch - Pre-op  Airway Management Planned: Oral ETT  Additional Equipment: None  Intra-op Plan:   Post-operative Plan: Extubation in OR  Informed Consent: I have reviewed the patients History and Physical, chart, labs and discussed the procedure including the risks, benefits and alternatives for the proposed anesthesia with the patient or authorized representative who has indicated his/her understanding and acceptance.     Dental advisory  given  Plan Discussed with: CRNA and Surgeon  Anesthesia Plan Comments: (PAT note by Antionette Poles, PA-C: 43 yo female follows with cardiology for hx of HTN, HLD, frequent PVCs s/p ablation 03/2019 at [facility], PSVT - maintained on Toprol XL 100mg  daily. Cath 2020 for eval of chest pain showed normal coronaries. Echo 05/2022 showed EF 60%, no significant valvular abnormalities. Seen by Jari Favre, PA-C on 05/24/23 for preop eval. Per note, "Ms. Leggette's perioperative risk of a major cardiac event is 0.4% according to the Revised Cardiac Risk Index (RCRI).  Therefore, she is at low risk for perioperative complications.   Her functional capacity is good at 5.07 METs according to the Duke Activity Status Index (DASI). Recommendations: According to ACC/AHA guidelines, no further cardiovascular testing needed.  The patient may proceed to surgery at acceptable risk."  Other pertinent hx includes HLA-B27 gene mutation (ankylosing spondylitis and anterior uveitis/iritis), GERD, migraines.  Hx of difficult intubation. Per anesthesia intubation note 11/30/20, "Difficulty Due To: Difficult Airway- due to anterior larynx Comments: Smooth IV induction. Easy mask. DL x 1 CRNA. Mil 3. Grade 4 view. Patient's mouth and airway narrow. Anterior larynx. DL x 1 CRNA Video-glide. Grade 1. Atraumatic oral intubation. +ETCO2 BBSE."  Proep labs reviewed, WNL.  EKG 12/19/22: Sinus brady. 55 bpm. Diffuse low voltage. Diffiuse nonspecific T wave abnormality  14d event monitor 06/07/22: Patient had a min HR of 40 bpm, max HR of 130 bpm, and avg HR of 59 bpm. Predominant underlying rhythm was Sinus Rhythm. 3 Supraventricular Tachycardia runs occurred, the run with the fastest interval lasting 12  beats with a max rate of 130 bpm (avg 122 bpm); the run with the fastest interval was also the longest. Junctional Rhythm was present. Junctional Rhythm was detected within +/- 45 seconds of symptomatic patient event(s). Isolated SVEs were rare  (<1.0%), SVE Couplets were rare (<1.0%), and SVE  Triplets were rare (<1.0%). Isolated VEs were rare (<1.0%), and no VE Couplets or VE Triplets were present.  Echocardiogram 05/14/2022: Left ventricle cavity is normal in size and wall thickness. Normal global wall motion. Normal LV systolic function with EF 60%. Normal diastolic filling pattern. Mild tricuspid regurgitation. No evidence of pulmonary hypertension  Cath 02/18/2019 (care everywhere): Conclusion:  1. Normal coronary arteries  2. Normal LV function and pressure  3. Noncardiac chest pain   Plan:  1. Low up with PCP for evaluation of noncardiac chest pain and  palpitations that are noncardiac   )        Anesthesia Quick Evaluation

## 2023-05-31 NOTE — Progress Notes (Addendum)
 PCP - Dr. Mila Palmer Cardiologist - Dr. Epifanio Lesches  PPM/ICD - denies   Chest x-ray - 12/19/18 EKG - 12/19/22 Stress Test - 02/02/19 ECHO - 05/14/22 Cardiac Cath - 02/18/19  Sleep Study - denies   DM- denies  Last dose of GLP1 agonist-  n/a   ASA/Blood Thinner Instructions: n/a   ERAS Protcol - no, NPO   COVID TEST- n/a   Anesthesia review: yes, cardiac hx  Patient denies shortness of breath, fever, cough and chest pain at PAT appointment   All instructions explained to the patient, with a verbal understanding of the material. Patient agrees to go over the instructions while at home for a better understanding.  The opportunity to ask questions was provided.

## 2023-06-05 ENCOUNTER — Ambulatory Visit (HOSPITAL_COMMUNITY): Payer: No Typology Code available for payment source | Admitting: Physician Assistant

## 2023-06-05 ENCOUNTER — Other Ambulatory Visit: Payer: Self-pay

## 2023-06-05 ENCOUNTER — Ambulatory Visit (HOSPITAL_BASED_OUTPATIENT_CLINIC_OR_DEPARTMENT_OTHER): Payer: No Typology Code available for payment source | Admitting: Certified Registered Nurse Anesthetist

## 2023-06-05 ENCOUNTER — Encounter (HOSPITAL_COMMUNITY)
Admission: RE | Disposition: A | Discharge status: Home / Self Care | Payer: Self-pay | Source: Home / Self Care | Attending: Otolaryngology

## 2023-06-05 ENCOUNTER — Encounter (HOSPITAL_COMMUNITY): Payer: Self-pay | Admitting: *Deleted

## 2023-06-05 ENCOUNTER — Observation Stay (HOSPITAL_COMMUNITY)
Admission: RE | Admit: 2023-06-05 | Discharge: 2023-06-06 | Disposition: A | Discharge status: Home / Self Care | Payer: No Typology Code available for payment source | Attending: Otolaryngology | Admitting: Otolaryngology

## 2023-06-05 DIAGNOSIS — D490 Neoplasm of unspecified behavior of digestive system: Secondary | ICD-10-CM | POA: Diagnosis not present

## 2023-06-05 DIAGNOSIS — K118 Other diseases of salivary glands: Secondary | ICD-10-CM | POA: Diagnosis not present

## 2023-06-05 DIAGNOSIS — I10 Essential (primary) hypertension: Secondary | ICD-10-CM | POA: Diagnosis not present

## 2023-06-05 DIAGNOSIS — Z79899 Other long term (current) drug therapy: Secondary | ICD-10-CM | POA: Insufficient documentation

## 2023-06-05 DIAGNOSIS — Z87891 Personal history of nicotine dependence: Secondary | ICD-10-CM | POA: Insufficient documentation

## 2023-06-05 DIAGNOSIS — D110 Benign neoplasm of parotid gland: Principal | ICD-10-CM | POA: Insufficient documentation

## 2023-06-05 HISTORY — PX: PAROTIDECTOMY: SHX2163

## 2023-06-05 LAB — CBC
HCT: 40 % (ref 36.0–46.0)
Hemoglobin: 13.8 g/dL (ref 12.0–15.0)
MCH: 30.1 pg (ref 26.0–34.0)
MCHC: 34.5 g/dL (ref 30.0–36.0)
MCV: 87.3 fL (ref 80.0–100.0)
Platelets: 300 10*3/uL (ref 150–400)
RBC: 4.58 MIL/uL (ref 3.87–5.11)
RDW: 12.6 % (ref 11.5–15.5)
WBC: 12.2 10*3/uL — ABNORMAL HIGH (ref 4.0–10.5)
nRBC: 0 % (ref 0.0–0.2)

## 2023-06-05 LAB — CREATININE, SERUM
Creatinine, Ser: 0.81 mg/dL (ref 0.44–1.00)
GFR, Estimated: >60 mL/min (ref >60)

## 2023-06-05 SURGERY — EXCISION, PAROTID GLAND
Anesthesia: General | Site: Neck | Laterality: Right

## 2023-06-05 MED ORDER — ROCURONIUM BROMIDE 10 MG/ML (PF) SYRINGE
PREFILLED_SYRINGE | INTRAVENOUS | Status: AC
  Filled 2023-06-05: qty 10

## 2023-06-05 MED ORDER — ONDANSETRON HCL 4 MG/2ML IJ SOLN
INTRAMUSCULAR | Status: AC
  Filled 2023-06-05: qty 2

## 2023-06-05 MED ORDER — OXYCODONE HCL 5 MG/5ML PO SOLN
5.0000 mg | Freq: Once | ORAL | Status: AC | PRN
  Administered 2023-06-05: 5 mg via ORAL

## 2023-06-05 MED ORDER — FENTANYL CITRATE (PF) 250 MCG/5ML IJ SOLN
INTRAMUSCULAR | Status: AC
  Filled 2023-06-05: qty 5

## 2023-06-05 MED ORDER — FENTANYL CITRATE (PF) 250 MCG/5ML IJ SOLN
INTRAMUSCULAR | Status: DC | PRN
  Administered 2023-06-05: 50 ug via INTRAVENOUS
  Administered 2023-06-05 (×2): 100 ug via INTRAVENOUS

## 2023-06-05 MED ORDER — PHENYLEPHRINE HCL-NACL 20-0.9 MG/250ML-% IV SOLN
INTRAVENOUS | Status: DC | PRN
  Administered 2023-06-05: 40 ug/min via INTRAVENOUS

## 2023-06-05 MED ORDER — HYDROMORPHONE HCL 1 MG/ML IJ SOLN
INTRAMUSCULAR | Status: AC
  Filled 2023-06-05: qty 1

## 2023-06-05 MED ORDER — HYDROMORPHONE HCL 1 MG/ML IJ SOLN
0.2500 mg | INTRAMUSCULAR | Status: DC | PRN
  Administered 2023-06-05 (×3): 0.5 mg via INTRAVENOUS

## 2023-06-05 MED ORDER — HYDROMORPHONE HCL 1 MG/ML IJ SOLN
INTRAMUSCULAR | Status: AC
  Filled 2023-06-05: qty 0.5

## 2023-06-05 MED ORDER — ENOXAPARIN SODIUM 40 MG/0.4ML IJ SOSY
40.0000 mg | PREFILLED_SYRINGE | INTRAMUSCULAR | Status: DC
Start: 2023-06-06 — End: 2023-06-06
  Administered 2023-06-06: 40 mg via SUBCUTANEOUS
  Filled 2023-06-05: qty 0.4

## 2023-06-05 MED ORDER — HYDROMORPHONE HCL 1 MG/ML IJ SOLN
INTRAMUSCULAR | Status: AC
Start: 2023-06-05 — End: 2023-06-06
  Filled 2023-06-05: qty 1

## 2023-06-05 MED ORDER — ONDANSETRON HCL 4 MG/2ML IJ SOLN
INTRAMUSCULAR | Status: DC | PRN
  Administered 2023-06-05: 4 mg via INTRAVENOUS

## 2023-06-05 MED ORDER — AMISULPRIDE (ANTIEMETIC) 5 MG/2ML IV SOLN
10.0000 mg | Freq: Once | INTRAVENOUS | Status: AC
  Administered 2023-06-05: 10 mg via INTRAVENOUS

## 2023-06-05 MED ORDER — DEXAMETHASONE SODIUM PHOSPHATE 10 MG/ML IJ SOLN
INTRAMUSCULAR | Status: DC | PRN
  Administered 2023-06-05: 10 mg via INTRAVENOUS

## 2023-06-05 MED ORDER — SCOPOLAMINE 1 MG/3DAYS TD PT72
1.0000 | MEDICATED_PATCH | TRANSDERMAL | Status: DC
  Administered 2023-06-05: 1.5 mg via TRANSDERMAL
  Filled 2023-06-05: qty 1

## 2023-06-05 MED ORDER — LIDOCAINE-EPINEPHRINE 1 %-1:100000 IJ SOLN
INTRAMUSCULAR | Status: AC
  Filled 2023-06-05: qty 1

## 2023-06-05 MED ORDER — DEXAMETHASONE SODIUM PHOSPHATE 10 MG/ML IJ SOLN
INTRAMUSCULAR | Status: AC
  Filled 2023-06-05: qty 1

## 2023-06-05 MED ORDER — PROPOFOL 10 MG/ML IV BOLUS
INTRAVENOUS | Status: AC
  Filled 2023-06-05: qty 20

## 2023-06-05 MED ORDER — ACETAMINOPHEN 160 MG/5ML PO SOLN: 650.0000 mg | ORAL | Status: DC | PRN

## 2023-06-05 MED ORDER — LIDOCAINE 2% (20 MG/ML) 5 ML SYRINGE
INTRAMUSCULAR | Status: AC
  Filled 2023-06-05: qty 5

## 2023-06-05 MED ORDER — CHLORHEXIDINE GLUCONATE 0.12 % MT SOLN
15.0000 mL | Freq: Once | OROMUCOSAL | Status: AC
  Administered 2023-06-05: 15 mL via OROMUCOSAL
  Filled 2023-06-05: qty 15

## 2023-06-05 MED ORDER — SENNA 8.6 MG PO TABS
1.0000 | ORAL_TABLET | Freq: Every day | ORAL | Status: DC
  Filled 2023-06-05: qty 1

## 2023-06-05 MED ORDER — MIDAZOLAM HCL 2 MG/2ML IJ SOLN
INTRAMUSCULAR | Status: DC | PRN
  Administered 2023-06-05: 2 mg via INTRAVENOUS

## 2023-06-05 MED ORDER — GLYCOPYRROLATE 0.2 MG/ML IJ SOLN
INTRAMUSCULAR | Status: DC | PRN
  Administered 2023-06-05: .2 mg via INTRAVENOUS

## 2023-06-05 MED ORDER — LIDOCAINE 2% (20 MG/ML) 5 ML SYRINGE
INTRAMUSCULAR | Status: DC | PRN
  Administered 2023-06-05: 40 mg via INTRAVENOUS

## 2023-06-05 MED ORDER — ONDANSETRON HCL 4 MG PO TABS: 4.0000 mg | ORAL_TABLET | ORAL | Status: DC | PRN

## 2023-06-05 MED ORDER — OXYCODONE HCL 5 MG PO TABS: 5.0000 mg | ORAL_TABLET | Freq: Once | ORAL | Status: AC | PRN

## 2023-06-05 MED ORDER — OXYCODONE HCL 5 MG/5ML PO SOLN
ORAL | Status: AC
  Filled 2023-06-05: qty 5

## 2023-06-05 MED ORDER — ONDANSETRON HCL 4 MG/2ML IJ SOLN: 4.0000 mg | INTRAMUSCULAR | Status: DC | PRN

## 2023-06-05 MED ORDER — ACETAMINOPHEN 500 MG PO TABS
1000.0000 mg | ORAL_TABLET | Freq: Once | ORAL | Status: AC
  Administered 2023-06-05: 1000 mg via ORAL
  Filled 2023-06-05: qty 2

## 2023-06-05 MED ORDER — OXYCODONE HCL 5 MG PO TABS
5.0000 mg | ORAL_TABLET | ORAL | Status: DC | PRN
  Administered 2023-06-05: 10 mg via ORAL
  Administered 2023-06-05 – 2023-06-06 (×2): 5 mg via ORAL
  Filled 2023-06-05: qty 1
  Filled 2023-06-05: qty 2
  Filled 2023-06-05: qty 1

## 2023-06-05 MED ORDER — MORPHINE SULFATE (PF) 2 MG/ML IV SOLN
1.0000 mg | INTRAVENOUS | Status: DC | PRN
  Administered 2023-06-05: 1 mg via INTRAVENOUS
  Filled 2023-06-05: qty 1

## 2023-06-05 MED ORDER — SUCCINYLCHOLINE CHLORIDE 200 MG/10ML IV SOSY
PREFILLED_SYRINGE | INTRAVENOUS | Status: DC | PRN
  Administered 2023-06-05: 120 mg via INTRAVENOUS

## 2023-06-05 MED ORDER — MEPERIDINE HCL 25 MG/ML IJ SOLN: 6.2500 mg | INTRAMUSCULAR | Status: DC | PRN

## 2023-06-05 MED ORDER — LIDOCAINE-EPINEPHRINE 1 %-1:100000 IJ SOLN
INTRAMUSCULAR | Status: DC | PRN
  Administered 2023-06-05: 6.5 mL

## 2023-06-05 MED ORDER — AMISULPRIDE (ANTIEMETIC) 5 MG/2ML IV SOLN
INTRAVENOUS | Status: AC
  Filled 2023-06-05: qty 4

## 2023-06-05 MED ORDER — PHENYLEPHRINE 80 MCG/ML (10ML) SYRINGE FOR IV PUSH (FOR BLOOD PRESSURE SUPPORT)
PREFILLED_SYRINGE | INTRAVENOUS | Status: DC | PRN
  Administered 2023-06-05: 80 ug via INTRAVENOUS

## 2023-06-05 MED ORDER — MIDAZOLAM HCL 2 MG/2ML IJ SOLN: 0.5000 mg | Freq: Once | INTRAMUSCULAR | Status: DC | PRN

## 2023-06-05 MED ORDER — MIDAZOLAM HCL 2 MG/2ML IJ SOLN
INTRAMUSCULAR | Status: AC
  Filled 2023-06-05: qty 2

## 2023-06-05 MED ORDER — 0.9 % SODIUM CHLORIDE (POUR BTL) OPTIME
TOPICAL | Status: DC | PRN
  Administered 2023-06-05: 1000 mL

## 2023-06-05 MED ORDER — PROPOFOL 10 MG/ML IV BOLUS
INTRAVENOUS | Status: DC | PRN
  Administered 2023-06-05: 150 mg via INTRAVENOUS
  Administered 2023-06-05: 50 mg via INTRAVENOUS

## 2023-06-05 MED ORDER — ORAL CARE MOUTH RINSE: 15.0000 mL | Freq: Once | OROMUCOSAL | Status: AC

## 2023-06-05 MED ORDER — CEFAZOLIN SODIUM-DEXTROSE 2-4 GM/100ML-% IV SOLN
2.0000 g | Freq: Once | INTRAVENOUS | Status: AC
  Administered 2023-06-05: 2 g via INTRAVENOUS
  Filled 2023-06-05: qty 100

## 2023-06-05 MED ORDER — LACTATED RINGERS IV SOLN: INTRAVENOUS | Status: DC

## 2023-06-05 MED ORDER — HYDROMORPHONE HCL 1 MG/ML IJ SOLN
INTRAMUSCULAR | Status: DC | PRN
  Administered 2023-06-05: .5 mg via INTRAVENOUS

## 2023-06-05 MED ORDER — ACETAMINOPHEN 650 MG RE SUPP: 650.0000 mg | RECTAL | Status: DC | PRN

## 2023-06-05 MED ORDER — EPHEDRINE SULFATE-NACL 50-0.9 MG/10ML-% IV SOSY
PREFILLED_SYRINGE | INTRAVENOUS | Status: DC | PRN
Start: 2023-06-05 — End: 2023-06-05
  Administered 2023-06-05: 10 mg via INTRAVENOUS
  Administered 2023-06-05: 5 mg via INTRAVENOUS
  Administered 2023-06-05: 10 mg via INTRAVENOUS

## 2023-06-05 MED ORDER — DEXMEDETOMIDINE HCL IN NACL 80 MCG/20ML IV SOLN
INTRAVENOUS | Status: DC | PRN
  Administered 2023-06-05 (×5): 4 ug via INTRAVENOUS

## 2023-06-05 SURGICAL SUPPLY — 53 items
BAG COUNTER SPONGE SURGICOUNT (BAG) ×1 IMPLANT
BENZOIN TINCTURE PRP APPL 2/3 (GAUZE/BANDAGES/DRESSINGS) ×1 IMPLANT
BIOPATCH RED 1 DISK 7.0 (GAUZE/BANDAGES/DRESSINGS) IMPLANT
BLADE CLIPPER SURG (BLADE) IMPLANT
BLADE SURG 15 STRL LF DISP TIS (BLADE) ×1 IMPLANT
CANISTER SUCT 3000ML PPV (MISCELLANEOUS) ×1 IMPLANT
CNTNR URN SCR LID CUP LEK RST (MISCELLANEOUS) ×1 IMPLANT
CORD BIPOLAR FORCEPS 12FT (ELECTRODE) ×1 IMPLANT
COVER SURGICAL LIGHT HANDLE (MISCELLANEOUS) ×1 IMPLANT
DERMABOND ADVANCED .7 DNX12 (GAUZE/BANDAGES/DRESSINGS) IMPLANT
DRAIN 10F 1/8 FL TRO SUC LF ST (DRAIN) IMPLANT
DRAIN CHANNEL 10M FLAT 3/4 FLT (DRAIN) IMPLANT
DRAIN JACKSON RD 7FR 3/32 (WOUND CARE) IMPLANT
DRAPE INCISE IOBAN 66X45 STRL (DRAPES) ×1 IMPLANT
DRAPE POUCH INSTRU U-SHP 10X18 (DRAPES) ×1 IMPLANT
DRAPE SURG 17X23 STRL (DRAPES) ×1 IMPLANT
DRSG TEGADERM 2-3/8X2-3/4 SM (GAUZE/BANDAGES/DRESSINGS) ×2 IMPLANT
DRSG TEGADERM 4X4.75 (GAUZE/BANDAGES/DRESSINGS) ×2 IMPLANT
ELECT COATED BLADE 2.86 ST (ELECTRODE) ×1 IMPLANT
ELECT PAIRED SUBDERMAL (MISCELLANEOUS) ×1 IMPLANT
ELECT REM PT RETURN 9FT ADLT (ELECTROSURGICAL) ×1 IMPLANT
ELECTRODE PAIRED SUBDERMAL (MISCELLANEOUS) ×1 IMPLANT
ELECTRODE REM PT RTRN 9FT ADLT (ELECTROSURGICAL) ×1 IMPLANT
EVACUATOR SILICONE 100CC (DRAIN) IMPLANT
FORCEPS BIPOLAR SPETZLER 8 1.0 (NEUROSURGERY SUPPLIES) ×1 IMPLANT
GAUZE 4X4 16PLY ~~LOC~~+RFID DBL (SPONGE) ×1 IMPLANT
GAUZE SPONGE 4X4 12PLY STRL (GAUZE/BANDAGES/DRESSINGS) ×1 IMPLANT
GLOVE BIO SURGEON STRL SZ 6.5 (GLOVE) ×1 IMPLANT
GLOVE BIO SURGEON STRL SZ7.5 (GLOVE) ×1 IMPLANT
GOWN STRL REUS W/ TWL LRG LVL3 (GOWN DISPOSABLE) ×2 IMPLANT
HEMOSTAT SURGICEL 2X14 (HEMOSTASIS) ×1 IMPLANT
KIT BASIN OR (CUSTOM PROCEDURE TRAY) ×1 IMPLANT
KIT TURNOVER KIT B (KITS) ×1 IMPLANT
NDL HYPO 25GX1X1/2 BEV (NEEDLE) ×1 IMPLANT
NEEDLE HYPO 25GX1X1/2 BEV (NEEDLE) ×1 IMPLANT
NS IRRIG 1000ML POUR BTL (IV SOLUTION) ×1 IMPLANT
PAD ARMBOARD 7.5X6 YLW CONV (MISCELLANEOUS) ×2 IMPLANT
PENCIL SMOKE EVACUATOR (MISCELLANEOUS) ×1 IMPLANT
PROBE NERVBE PRASS .33 (MISCELLANEOUS) ×1 IMPLANT
SPONGE INTESTINAL PEANUT (DISPOSABLE) ×1 IMPLANT
STAPLER VISISTAT 35W (STAPLE) IMPLANT
STRIP CLOSURE SKIN 1/2X4 (GAUZE/BANDAGES/DRESSINGS) ×1 IMPLANT
STRIP CLOSURE SKIN 1/4X4 (GAUZE/BANDAGES/DRESSINGS) IMPLANT
SUT MNCRL AB 4-0 PS2 18 (SUTURE) ×2 IMPLANT
SUT MON AB 5-0 PS2 18 (SUTURE) IMPLANT
SUT SILK 2 0 REEL (SUTURE) ×1 IMPLANT
SUT SILK 2 0 SH (SUTURE) IMPLANT
SUT SILK 2 0 SH CR/8 (SUTURE) ×1 IMPLANT
SUT SILK 3 0 REEL (SUTURE) ×1 IMPLANT
SUT VIC AB 3-0 SH 27X BRD (SUTURE) IMPLANT
SUT VIC AB 4-0 PS2 27 (SUTURE) ×1 IMPLANT
TRAY ENT MC OR (CUSTOM PROCEDURE TRAY) ×1 IMPLANT
WATER STERILE IRR 1000ML POUR (IV SOLUTION) IMPLANT

## 2023-06-05 NOTE — Anesthesia Postprocedure Evaluation (Signed)
 Anesthesia Post Note  Patient: Kayla Gross  Procedure(s) Performed: PAROTIDECTOMY (Right: Neck)     Patient location during evaluation: PACU Anesthesia Type: General Level of consciousness: patient cooperative, awake and alert and oriented Pain management: pain level controlled Vital Signs Assessment: post-procedure vital signs reviewed and stable Respiratory status: spontaneous breathing, nonlabored ventilation and respiratory function stable Cardiovascular status: blood pressure returned to baseline and stable : nausea relieved. Anesthetic complications: no   No notable events documented.  Last Vitals:  Vitals:   06/05/23 1245 06/05/23 1300  BP: 107/73 103/71  Pulse: 66 65  Resp: 18 12  Temp:    SpO2: 93% 93%    Last Pain:  Vitals:   06/05/23 1300  TempSrc:   PainSc: Asleep                 Dimitri Dsouza,E. Nithya Meriweather

## 2023-06-05 NOTE — Op Note (Addendum)
 Operative Report  Preoperative diagnosis: Right parotid neoplasm  Postop diagnosis: same  Procedure: Right superficial parotidectomy with dissection of facial nerve  Surgeon: Ashok Croon, MD  Assist: Eyvonne Mechanic, PA-C  Anesth: General and local with 1% lidocaine with 1:100,000 epinephrine  Complications: None  Findings: Right mid and lower parotid mass 3 cm approximate size  Specimen:  Right parotid mass   Indication: 43 year-old female with a history of right parotid mass, and preoperative biopsy results consistent with pleomorphic adenoma. She was advised to undergo superficial parotidectomy with facial nerve dissection. Risks and benefits of the procedure were discussed with the patient and family, and they elected to proceed.   Description:  The patient was brought to the operating room and placed on the operative table in the supine position.  Anesthesia was induced and the patient was intubated by the anesthesia team without difficulty.  The patient was given intravenous antibiotics.  The Right parotid incision was marked with a marking pen and injected with local anesthetic.  The nerve integrity monitor was placed along the face for facial nerve monitoring and turned on during the case.  The right face and neck were prepped and draped in sterile fashion.  A modified Blair incision was made with a 15 blade scalpel and extended through the subcutaneous and platysma layers with Bovie electrocautery.  A pre-parotid flap was elevated anteriorly and the earlobe was freed.  Skin flaps were sutured back with stay sutures.  Dissection was then performed anterior to the tragus and parotid tissue was elevated off of the mastoid. We developed a plane between parotid and tragal cartilage. Primary branch of the great auricular nerve was identified and reflected laterally.  The facial nerve was identified in its expected location. Careful sequential dissection of the branches was performed  to bring the mass up and out of the neck. Bipolar cautery was the primary method of hemostasis.   With further dissection to the stylomastoid foramen, the main trunk of the facial nerve was identified.  The nerve was dissected to the main division and then down the inferior superior and mid branches, dividing parotid tissue using the bipolar electrocautery. Throughout the case, a hand-held nerve stimulator and continuous nerve monitoring was utilized to identify and carefully dissect the branches of the facial nerve.  The marginal mandibular nerve was then traced in an antegrade fashion until the inferior gland was able to be freed and the nerve kept intact.  The nerve stimulator was used for assistance.  The inferior/mid parotid gland with mass was then dissected from surrounding tissues and removed.  This was passed to nursing for pathology.  The surgical site was then irrigated copiously with saline.  A 10 JP suction drain was placed in the depth of the wound and secured at the skin with 2-0 Silk with a standard drain stitch.  The flaps were released and laid back down. The deep layers were closed with 3-0 Vicryls. The skin was closed with 5-0 Monocryl suture.  The drain was placed to bulb suction. Dermabond and steri strips were applied along the incision.  Drapes were removed and the drain was secured to the shoulder with tape.  She was then returned to anesthesia for wake-up and was extubated and moved to the recovery room in stable condition. All counts were correct. There were no complications.  The advanced practice practitioner (APP) assisted throughout the case. The APP was essential in retraction and counter traction when needed to make the case progress smoothly.  This retraction and assistance made it possible to see the tissue planes for the procedure. The assistance was needed for blood control, tissue re-approximation and assisted with closure of the incision site

## 2023-06-05 NOTE — H&P (Signed)
 Kayla Gross is an 43 y.o. female.    Chief Complaint:  right parotid neoplasm  HPI: Patient presents today for planned elective procedure.  She denies any interval change in history since office visit on 04/24/23. Pathology on core bx was c/w pleomorphic adenoma, and MRI revealed 2.8 cm tumor in superficial parotid gland, no lymphadenopathy.  Past Medical History:  Diagnosis Date   Ankylosing spondylitis (HCC)    Anxiety    Bipolar 1 disorder (HCC)    pt denies   Chronic headache    Chronic low back pain    Depression    Dysrhythmia    PVCS   Fibromyalgia    Hypertension    OCD (obsessive compulsive disorder)    Pectus excavatum    Umbilical hernia    x3 (midline repaired)   Vitamin D deficiency     Past Surgical History:  Procedure Laterality Date   ABLATION     cardiac for irregular heart rhythm   BIOPSY  05/24/2022   Procedure: BIOPSY;  Surgeon: Willis Modena, MD;  Location: [facility];  Service: Gastroenterology;;   COLONOSCOPY WITH PROPOFOL N/A 05/24/2022   Procedure: COLONOSCOPY WITH PROPOFOL;  Surgeon: Willis Modena, MD;  Location: [facility];  Service: Gastroenterology;  Laterality: N/A;   ESOPHAGOGASTRODUODENOSCOPY (EGD) WITH PROPOFOL N/A 05/24/2022   Procedure: ESOPHAGOGASTRODUODENOSCOPY (EGD) WITH PROPOFOL;  Surgeon: Willis Modena, MD;  Location: [facility];  Service: Gastroenterology;  Laterality: N/A;   HEMOSTASIS CLIP PLACEMENT  05/24/2022   Procedure: HEMOSTASIS CLIP PLACEMENT;  Surgeon: Willis Modena, MD;  Location: [facility];  Service: Gastroenterology;;   HERNIA REPAIR  2021   POLYPECTOMY  05/24/2022   Procedure: POLYPECTOMY;  Surgeon: Willis Modena, MD;  Location: [facility];  Service: Gastroenterology;;   TONSILLECTOMY     VAGINAL HYSTERECTOMY N/A 11/30/2020   Procedure: HYSTERECTOMY TOTAL VAGINAL WITH BILATERAL SALPINGECTOMY.;  Surgeon: Steva Ready, DO;  Location: [facility] OR;  Service: Gynecology;  Laterality: N/A;    Family History   Problem Relation Age of Onset   Diabetes Sister     Social History:  reports that she quit smoking about 5 years ago. Her smoking use included cigarettes. She started smoking about 29 years ago. She has a 5.8 pack-year smoking history. She has never used smokeless tobacco. She reports current alcohol use of about 2.0 standard drinks of alcohol per week. She reports that she does not use drugs.  Allergies: No Known Allergies  Medications Prior to Admission  Medication Sig Dispense Refill   amLODipine (NORVASC) 2.5 MG tablet Take 1 tablet (2.5 mg total) by mouth daily. 90 tablet 3   baclofen (LIORESAL) 10 MG tablet Take 10 mg by mouth 3 (three) times daily as needed for muscle spasms.     gabapentin (NEURONTIN) 300 MG capsule TAKE 1 CAPSULE BY MOUTH AT BEDTIME AS NEEDED (PAIN). (Patient taking differently: Take 300 mg by mouth 3 (three) times daily.) 30 capsule 5   HUMIRA, 2 PEN, 40 MG/0.4ML pen Inject 40 mg as directed every 14 (fourteen) days.     hydrOXYzine (ATARAX) 25 MG tablet Take 25 mg by mouth 2 (two) times daily.     metoprolol succinate (TOPROL-XL) 100 MG 24 hr tablet Take 1 tablet (100 mg total) by mouth daily. 90 tablet 3   SUMAtriptan (IMITREX) 50 MG tablet Take 1 tablet (50 mg total) by mouth every 2 (two) hours as needed for migraine. May repeat in 2 hours if headache persists or recurs. 10 tablet 0  No results found for this or any previous visit (from the past 48 hours). No results found.  ROS: Review of Systems  Constitutional:  Negative for chills and fever.  HENT:  Negative for congestion and ear pain.   Eyes:  Negative for blurred vision.  Respiratory:  Negative for cough.   Cardiovascular:  Negative for chest pain.  Gastrointestinal:  Negative for nausea and vomiting.  Genitourinary:  Negative for dysuria.  Musculoskeletal:  Negative for neck pain.  Skin:  Negative for rash.  Neurological:  Negative for headaches.  Psychiatric/Behavioral:  The patient does  not have insomnia.     Blood pressure 116/86, pulse (!) 53, temperature 97.8 F (36.6 C), temperature source Oral, resp. rate 16, height 5\' 6"  (1.676 m), weight 88.5 kg, last menstrual period 11/02/2020, SpO2 97%.  PHYSICAL EXAM: Physical Exam Constitutional:      Appearance: Normal appearance.  HENT:     Head: Normocephalic and atraumatic.     Nose: Nose normal.     Mouth/Throat:     Mouth: Mucous membranes are moist.  Eyes:     Extraocular Movements: Extraocular movements intact.     Pupils: Pupils are equal, round, and reactive to light.  Cardiovascular:     Rate and Rhythm: Normal rate.  Pulmonary:     Effort: Pulmonary effort is normal. No respiratory distress.     Breath sounds: No stridor.  Abdominal:     General: Abdomen is flat.  Musculoskeletal:        General: No swelling.     Cervical back: Normal range of motion.  Skin:    General: Skin is warm and dry.  Neurological:     General: No focal deficit present.     Mental Status: She is alert and oriented to person, place, and time.     Assessment/Plan Right parotid mass with core bx (+) pleomorphic adenoma   - risks and benefits of the procedure discussed with patient and she would like to proceed with right superficial parotidectomy    Kayla Gross 06/05/2023, 7:19 AM

## 2023-06-05 NOTE — Anesthesia Procedure Notes (Signed)
 Procedure Name: Intubation Date/Time: 06/05/2023 7:54 AM  Performed by: Colbert Coyer, CRNAPre-anesthesia Checklist: Patient identified, Emergency Drugs available, Suction available and Patient being monitored Patient Re-evaluated:Patient Re-evaluated prior to induction Oxygen Delivery Method: Circle System Utilized Preoxygenation: Pre-oxygenation with 100% oxygen Induction Type: IV induction Ventilation: Mask ventilation without difficulty Laryngoscope Size: Glidescope and 4 Grade View: Grade I Tube type: Oral Tube size: 7.0 mm Number of attempts: 1 Airway Equipment and Method: Stylet Placement Confirmation: ETT inserted through vocal cords under direct vision, positive ETCO2 and breath sounds checked- equal and bilateral Secured at: 22 cm Tube secured with: Tape Dental Injury: Teeth and Oropharynx as per pre-operative assessment

## 2023-06-05 NOTE — Discharge Instructions (Signed)
 Parotid Surgery Postoperative Care Instructions:  The Surgery Itself Parotid surgery involves general anesthesia, typically for 2-3 hours. Patients may  be quite sedated for several hours after surgery and may remain sleepy for much  of the day. Nausea and vomiting are occasionally seen, and usually resolve by  the evening of surgery - even without additional medications. Some patients stay overnight in the hospital; other patients can go home the evening of surgery.  Most patients will have a drain in place after surgery, which is usually removed in the office 2-4 days after surgery.   Your Incision Your incision is closed with sutures, there may be tape or skin glue over your  incision. Do not remove this tape or the glue. You can shower and wash your hair  as usual starting 24 hours after your drain is removed. You may wash in a  bathtub prior to that time if you are careful not to get your neck wet. Use a dab of  Bacitracin ointment on your drain site (under and behind your ear) before and  after showering. It is normal to have some red or pink drainage from your drain  exit site for 1-2 days after the drain is removed. Do not soak or scrub the incision.  You might notice swelling and bruising around your incision, upper neck and face  after surgery. In addition, the scar may become pink and hard. This hardening  will peak at about 6 weeks and may result in some tightness, which will  disappear over the next 2 to 3 months. You will have numbness of the skin  around the incision and the lower ear on the side of surgery. This will resolve  slowly after surgery except on the earlobe, where some patients have permanent  numbness. Be very careful when shaving if your neck skin is numb. You should  apply sunscreen on your incision site starting 1 month after surgery EVERY day  for the first year after surgery. This will prevent a red or pink scar and give you  the best cosmetic result for  your scar. A daily moisturizer with sunscreen  (example Oil of Olay with SPF 30) is fine.   Limitations You can start resuming normal activities as tolerated 7 days after surgery. For  some patients, lifting can cause pain and stretching at the surgery site for up to 3  weeks after surgery. You should not drive or drink alcohol while taking pain  medications. Most people can return to work/school 1-2 weeks after surgery,  but there may be physical limitations as far as what you may do while at work. Your  surgeon will review your specific limitations and release you when you are ready  to return to work.   Medications ? Pain medication can be used for pain as prescribed. Pain is expected after  surgery. Your neck and face will be sore and pain will be worse when the  neck is stretched and moved. As the surgical site heals, pain will resolve  over the course of a week. It is not uncommon for pain to get worse when  you first go home because your activity may increase but from that point  on the pain should improve every day. Pain medications can cause  nausea, which can be prevented if you take them with food or milk. ? You may be given a stool softener (Colace) because pain medications may  make you constipated. It is recommended that you use these  starting right  after surgery -- you may discontinue if you find that you are having normal  or loose stools. ? Bacitracin ointment should be applied to the drain exit site three times a  day for 2 days after the drain is removed. It should also be applied before  and after showering for 24 hours after the drain is removed. Bacitracin  ointment can also be applied to the incision once the tape is removed or  the glue peels off. This prevents scabbing and itching. ? Take all of your routine medications as prescribed, unless told otherwise  by your surgeon. Any medications that thin the blood should be avoided.  ? IT IS OK TO TAKE OVER THE  COUNTER PAIN MEDICATION  (IBUPROFEN, NAPROXEN, or ACETAMINOPHEN) IN ADDITION TO  YOUR PRESCRIBED MEDICATIONS. DO NOT TAKE ASPIRIN UNLESS  CLEARED WITH YOUR SURGEON.  ? Limit Acetaminophen/Tylenol to less than 4,000mg /day  ? Limit Ibuprofen/Motrin to less than 3,600mg /day  Pain ? The main complaint following parotid surgery is pain with eating,  swallowing and neck movement. Some people experience a dull ache,  while others feel a sharp pain. This should not keep you from eating  anything you want and will improve daily after surgery. When the sensation  returns to the skin of the neck and ear, there may be feelings like electric  shocks from the sensory nerves returning to normal function. The nerve  that controls movement of the facial muscles is exposed during parotid  surgery. Because of this, some patients have weakness in those muscles  after surgery from swelling which will resolve with time. This may make the face feel "heavy" or "sluggish."  Cough If your operation was done under general anesthesia, you may feel like you have  phlegm in your throat or a sore throat. This is usually because there was a tube  in your windpipe while you were asleep that caused irritation that you perceive as  phlegm. You will notice that if you cough, very little phlegm will come up. This  should clear up in 4 to 5 days.  Reasons to call your surgeon's office ? Persistent fever over 101 F ? Bleeding from the neck incision ? Increasing facial or neck swelling ? Sudden loss of facial movement ? Pain that is not relieved by your medications ? Purulent drainage (pus) from the incision ? Significant redness surrounding the incision that is worsening or getting bigger  How to Contact Your Surgeon Please call [phone] (Dr Leighton Roach office) with any questions or concerns after the surgery. You will be seen on Monday 01/28/23 at 9 am in our office at [facility] for post-op check and  drain removal.   You were prescribed the following medications:  Take Oxycodone - narcotic pain medication - only if you have severe pain. If you take it, you might require over the counter stool softeners to prevent constipation. You cannot drive or operate machinery when taking narcotic pain medications.  Take antibiotic (Augmentin) to prevent infection Take steroid taper (Medrol dose pack) to help with swelling Use artificial tears to help with eye itching

## 2023-06-05 NOTE — Transfer of Care (Signed)
 Immediate Anesthesia Transfer of Care Note  Patient: Kayla Gross  Procedure(s) Performed: PAROTIDECTOMY (Right: Neck)  Patient Location: PACU  Anesthesia Type:General  Level of Consciousness: awake, drowsy, and patient cooperative  Airway & Oxygen Therapy: Patient Spontanous Breathing and Patient connected to face mask oxygen  Post-op Assessment: Report given to RN and Post -op Vital signs reviewed and stable  Post vital signs: Reviewed and stable  Last Vitals:  Vitals Value Taken Time  BP 119/78 06/05/23 1200  Temp 98.3   Pulse 75 06/05/23 1203  Resp 16 06/05/23 1203  SpO2 92 % 06/05/23 1203  Vitals shown include unfiled device data.  Last Pain:  Vitals:   06/05/23 0552  TempSrc: Oral  PainSc: 3       Patients Stated Pain Goal: 1 (06/05/23 0552)  Complications: No notable events documented.

## 2023-06-05 NOTE — Progress Notes (Signed)
 Pt arrived to 6 north room 11 alert and oriented x4. Pain level 4/10 in neck. JP drain in neck emptied 10mL and returned back to suction. Bed in lowest position. Call light in reach. Late lunch tray ordered. Will continue to monitor pt.

## 2023-06-06 ENCOUNTER — Encounter (HOSPITAL_COMMUNITY): Payer: Self-pay | Admitting: Otolaryngology

## 2023-06-06 DIAGNOSIS — D110 Benign neoplasm of parotid gland: Secondary | ICD-10-CM | POA: Diagnosis not present

## 2023-06-06 LAB — SURGICAL PATHOLOGY

## 2023-06-06 MED ORDER — SENNA 8.6 MG PO TABS: 1.0000 | ORAL_TABLET | Freq: Every evening | ORAL | 0 refills | Status: DC | PRN

## 2023-06-06 MED ORDER — IBUPROFEN 600 MG PO TABS: 600.0000 mg | ORAL_TABLET | Freq: Four times a day (QID) | ORAL | 0 refills | Status: DC

## 2023-06-06 MED ORDER — OXYCODONE HCL 5 MG PO TABS
5.0000 mg | ORAL_TABLET | Freq: Four times a day (QID) | ORAL | 0 refills | Status: DC | PRN
Start: 2023-06-06 — End: 2024-01-27

## 2023-06-06 MED ORDER — ACETAMINOPHEN 500 MG PO TABS: 500.0000 mg | ORAL_TABLET | Freq: Four times a day (QID) | ORAL | 0 refills | Status: DC

## 2023-06-06 NOTE — Discharge Summary (Signed)
 Physician Discharge Summary  Patient ID: Kayla Gross MRN: [id number] DOB/AGE: Mar 18, 1981 42 y.o.  Admit date: 06/05/2023 Discharge date: 06/06/2023  Admission Diagnoses:  Discharge Diagnoses:  Principal Problem:   Parotid neoplasm Active Problems:   Benign mixed parotid tumor   Discharged Condition: good  Hospital Course:  71 yoF who underwent right superficial parotidectomy 06/05/23 and stayed for 24 hr observation. Pain was well-controlled. JP output slowly decreased, and was ~ 45 cc/24 hrs. She was tolerating PO. She had no N/V CP or dyspnea. She had no facial muscle weakness. She was discharged home on POD#1 with instructions to return to the office on POD#2 for drain removal.     Discharge Exam: Blood pressure 113/74, pulse (!) 53, temperature (!) 97.5 F (36.4 C), temperature source Oral, resp. rate 18, height 5\' 6"  (1.676 m), weight 88.5 kg, last menstrual period 11/02/2020, SpO2 100%.  General appearance: alert and cooperative Head: Normocephalic, without obvious abnormality, atraumatic Eyes: conjunctivae/corneas clear. PERRL, EOM's intact. Fundi benign. Ears: normal TM's and external ear canals both ears Nose: Nares normal. Septum midline. Mucosa normal. No drainage or sinus tenderness., no discharge Throat: lips, mucosa, and tongue normal; teeth and gums normal Neck: no adenopathy and supple, symmetrical, trachea midline Resp: normal effort and no stridor Face: CN 7 intact, No swelling or erythema along the right pre-auricular/neck incision. JP with serosanguinous output   Disposition:   Discharge Instructions     Diet - low sodium heart healthy   Complete by: As directed    Increase activity slowly   Complete by: As directed       Allergies as of 06/06/2023   No Known Allergies      Medication List     TAKE these medications    acetaminophen 500 MG tablet Commonly known as: TYLENOL Take 1 tablet (500 mg total) by mouth every 6 (six) hours. Take  every 6 hrs x 2-3 days then take every 6 hrs as needed   amLODipine 2.5 MG tablet Commonly known as: NORVASC Take 1 tablet (2.5 mg total) by mouth daily.   baclofen 10 MG tablet Commonly known as: LIORESAL Take 10 mg by mouth 3 (three) times daily as needed for muscle spasms.   gabapentin 300 MG capsule Commonly known as: NEURONTIN TAKE 1 CAPSULE BY MOUTH AT BEDTIME AS NEEDED (PAIN). What changed: See the new instructions.   Humira (2 Pen) 40 MG/0.4ML pen Generic drug: adalimumab Inject 40 mg as directed every 14 (fourteen) days.   hydrOXYzine 25 MG tablet Commonly known as: ATARAX Take 25 mg by mouth 2 (two) times daily.   ibuprofen 600 MG tablet Commonly known as: ADVIL Take 1 tablet (600 mg total) by mouth every 6 (six) hours. Please take every 6 hrs, and stagger this medication 3 hrs apart from Tylenol   metoprolol succinate 100 MG 24 hr tablet Commonly known as: TOPROL-XL Take 1 tablet (100 mg total) by mouth daily.   oxyCODONE 5 MG immediate release tablet Commonly known as: Oxy IR/ROXICODONE Take 1 tablet (5 mg total) by mouth every 6 (six) hours as needed for moderate pain (pain score 4-6) or severe pain (pain score 7-10) (for post-op pain).   senna 8.6 MG Tabs tablet Commonly known as: SENOKOT Take 1 tablet (8.6 mg total) by mouth at bedtime as needed for mild constipation (take if on narcotic pain medications to prevent constipation or if you experience constipation).   SUMAtriptan 50 MG tablet Commonly known as: Imitrex Take 1 tablet (50  mg total) by mouth every 2 (two) hours as needed for migraine. May repeat in 2 hours if headache persists or recurs.        Follow-up Information     Mila Palmer, MD Follow up in 1 week(s).   Specialty: Family Medicine Contact information: [address] [place] [address] [phone]                 Signed: Ashok Croon 06/06/2023, 7:45 AM

## 2023-06-06 NOTE — Progress Notes (Signed)
 ENT PROGRESS NOTE   Subjective: Patient seen and examined at bedside. No issues overnight. Able to eat without difficulties. Took two of Oxy for pain. Denies facial weakness. Report mild numbness at the right ear lobe. JP output was ~ 45 ml.   Objective: Vital signs in last 24 hours: Temp:  [97.3 F (36.3 C)-98.3 F (36.8 C)] 97.5 F (36.4 C) (02/27 0449) Pulse Rate:  [51-81] 53 (02/27 0449) Resp:  [11-18] 18 (02/27 0449) BP: (98-120)/(61-81) 113/74 (02/27 0449) SpO2:  [93 %-100 %] 100 % (02/27 0449)  General appearance: alert and cooperative Head: Normocephalic, without obvious abnormality, atraumatic Eyes: conjunctivae/corneas clear. PERRL, EOM's intact. Fundi benign. Ears: normal TM's and external ear canals both ears Nose: Nares normal. Septum midline. Mucosa normal. No drainage or sinus tenderness., no discharge Throat: lips, mucosa, and tongue normal; teeth and gums normal Neck: no adenopathy and supple, symmetrical, trachea midline Resp: normal effort and no stridor Face: CN 7 intact, No swelling or erythema along the right pre-auricular/neck incision. JP with serosanguinous output   @LABLAST2 (wbc:2,hgb:2,hct:2,plt:2) Recent Labs    06/05/23 1513  CREATININE 0.81    Assessment/Plan: POD#1 s/p right superficial parotidectomy for right parotid tumor. Doing well.   - ok to d/c home with JP and instructions on how to empty the drain  - Rx sent for Tylenol/Motrin and Oxy to the pharmacy - outpatient f/u with me tomorrow in am for JP removal - we will schedule her appt.   Dispo: home today with JP   LOS: 1 day    Ashok Croon, MD 06/06/2023, 7:31 AM

## 2023-06-06 NOTE — Plan of Care (Signed)
  Problem: Education: Goal: Knowledge of General Education information will improve Description: Including pain rating scale, medication(s)/side effects and non-pharmacologic comfort measures Outcome: Progressing   Problem: Clinical Measurements: Goal: Will remain free from infection Outcome: Progressing   Problem: Pain Managment: Goal: General experience of comfort will improve and/or be controlled Outcome: Progressing

## 2023-06-07 ENCOUNTER — Ambulatory Visit (INDEPENDENT_AMBULATORY_CARE_PROVIDER_SITE_OTHER): Payer: No Typology Code available for payment source | Admitting: Otolaryngology

## 2023-06-07 ENCOUNTER — Encounter (INDEPENDENT_AMBULATORY_CARE_PROVIDER_SITE_OTHER): Payer: Self-pay | Admitting: Otolaryngology

## 2023-06-07 VITALS — BP 120/82 | HR 62

## 2023-06-07 DIAGNOSIS — Z9889 Other specified postprocedural states: Secondary | ICD-10-CM

## 2023-06-07 DIAGNOSIS — K118 Other diseases of salivary glands: Secondary | ICD-10-CM

## 2023-06-07 DIAGNOSIS — D3150 Benign neoplasm of unspecified lacrimal gland and duct: Secondary | ICD-10-CM

## 2023-06-07 NOTE — Progress Notes (Signed)
 ENT progress note  Postop day 2 following right superficial parotidectomy with facial nerve dissection for removal of 3 cm pleomorphic adenoma.  Final pathology consistent with pleomorphic adenoma.  Here for JP drain removal.  Total output was 25 cc over the last 24 hours, serosanguineous.  She denies significant pain.   Physical exam Cranial nerve VII intact JP with serosanguineous output JP removed  Assessment and plan -RTC in 2 to 3 weeks for wound check  Ashok Croon, MD

## 2023-06-19 ENCOUNTER — Ambulatory Visit (INDEPENDENT_AMBULATORY_CARE_PROVIDER_SITE_OTHER): Payer: No Typology Code available for payment source | Admitting: Otolaryngology

## 2023-06-19 ENCOUNTER — Encounter (INDEPENDENT_AMBULATORY_CARE_PROVIDER_SITE_OTHER): Payer: Self-pay | Admitting: Otolaryngology

## 2023-06-19 VITALS — BP 114/74 | HR 71

## 2023-06-19 DIAGNOSIS — K118 Other diseases of salivary glands: Secondary | ICD-10-CM

## 2023-06-19 DIAGNOSIS — Z9889 Other specified postprocedural states: Secondary | ICD-10-CM

## 2023-06-19 DIAGNOSIS — D3150 Benign neoplasm of unspecified lacrimal gland and duct: Secondary | ICD-10-CM

## 2023-06-19 NOTE — Progress Notes (Signed)
 ENT progress note  She is here for 2 week post-op visit following right superficial parotidectomy with facial nerve dissection for removal of 3 cm pleomorphic adenoma.  Final pathology consistent with pleomorphic adenoma. Still has sensation of tightness below the right ear lobe, but no pain. She mas mild numbness in the area below the ear lobule. No issues moving he face, no swelling or redness. Marland Kitchen  Physical exam Cranial nerve VII intact Right pre-auricular incision c/d/I no edema or ecchymosis no fluctuance  Incision is healing as expected   Assessment and plan     Postoperative recovery following R superficial parotidectomy 2 weeks ago  Experiencing firmness and tightness in the surgical area, expected to resolve in a few weeks. The surgery was extensive, involving CN 7 dissection and I advised that her sensation below the ear will recover in the next few months. The tumor was c/w pleomorphic adenoma. Recovering as expected, with good facial nerve function and minimal visible scarring - Advise protection of the surgical area from sun exposure. - Instructed to apply sunscreen in one to two weeks to improve scar appearance. - If symptoms persist beyond three to four months, instruct to call and make an appointment.      Ashok Croon, MD

## 2023-12-04 ENCOUNTER — Telehealth: Payer: Self-pay | Admitting: Cardiology

## 2023-12-19 NOTE — Telephone Encounter (Signed)
 error

## 2024-01-26 NOTE — Progress Notes (Unsigned)
 Cardiology Office Note   Date:  01/27/2024  ID:  Kayla Gross, DOB 1980-06-06, MRN [id number] PCP: Verena Mems, MD  [facility] Providers Cardiologist:  Lonni LITTIE Nanas, MD  APP: Orren Fabry, PA-C  History of Present Illness Kayla Gross is a 43 y.o. female with a past medical history of hypertension, hyperlipidemia, ankylosing spondylitis, and PSVT here for follow-up appointment.  Patient was doing well at last follow-up a year ago.  Palpitations were controlled with metoprolol .  No other symptoms.  Today, she presents with a history of hypertension and ankylosing spondylitis for medication management and follow-up.  She has experienced significant weight loss over the past year, decreasing from 199.7 pounds to 152 pounds, with a target of 150 pounds. This weight loss has been associated with a decrease in heart rate and improved blood pressure control. She attributes her earlier weight gain to surgery for tumor removal on her gland.  Her current medications include metoprolol , gabapentin , baclofen , Humira, hydroxyzine, and sumatriptan . She has discontinued amoxapine and amlodipine .  Her recent lipid panel showed an LDL level of 121, which is slightly elevated. She follows a diet high in protein and fiber, with limited red meat, and aims to maintain a Mediterranean diet  Her heart rate is in the 40s today and she states it has been this low in the past.  She believes she is on too much metoprolol .  Reports no shortness of breath nor dyspnea on exertion. Reports no chest pain, pressure, or tightness. No edema, orthopnea, PND. Reports no palpitations.   Discussed the use of AI scribe software for clinical note transcription with the patient, who gave verbal consent to proceed.   ROS: Pertinent ROS in HPI  Studies Reviewed  EKG Interpretation Date/Time:  Monday January 27 2024 10:05:40 EDT Ventricular Rate:  45 PR Interval:  154 QRS Duration:  100 QT  Interval:  486 QTC Calculation: 420 R Axis:   -21  Text Interpretation: Sinus bradycardia Possible Left atrial enlargement Low voltage QRS Incomplete right bundle branch block When compared with ECG of 22-May-2022 11:42, PREVIOUS ECG IS PRESENT Confirmed by Fabry Orren [phone]) on 01/27/2024 10:32:24 [facility] 05/14/22   Patch Wear Time:  13 days and 16 hours (2024-02-05T12:38:37-0500 to 2024-02-19T05:14:35-0500)   Patient had a min HR of 40 bpm, max HR of 130 bpm, and avg HR of 59 bpm. Predominant underlying rhythm was Sinus Rhythm. 3 Supraventricular Tachycardia runs occurred, the run with the fastest interval lasting 12 beats with a max rate of 130 bpm (avg 122 bpm); the run with the fastest interval was also the longest. Junctional Rhythm was present. Junctional Rhythm was detected within +/- 45 seconds of symptomatic patient event(s). Isolated SVEs were rare (<1.0%), SVE Couplets were rare (<1.0%), and SVE  Triplets were rare (<1.0%). Isolated VEs were rare (<1.0%), and no VE Couplets or VE Triplets were present.    Physical Exam VS:  BP 110/80 (BP Location: Right Arm, Patient Position: Sitting, Cuff Size: Normal)   Pulse (!) 45   Ht 5' 6 (1.676 m)   Wt 162 lb (73.5 kg)   LMP 11/02/2020 (Approximate)   SpO2 98%   BMI 26.15 kg/m        Wt Readings from Last 3 Encounters:  01/27/24 162 lb (73.5 kg)  06/05/23 195 lb 1.7 oz (88.5 kg)  05/31/23 195 lb 3.2 oz (88.5 kg)    GEN: Well nourished, well developed in no acute distress NECK: No JVD; No carotid bruits CARDIAC:  RRR, no murmurs, rubs, gallops RESPIRATORY:  Clear to auscultation without rales, wheezing or rhonchi  ABDOMEN: Soft, non-tender, non-distended EXTREMITIES:  No edema; No deformity   ASSESSMENT AND PLAN  Supraventricular tachycardia and essential hypertension Significant heart rate drop likely due to weight loss. Blood pressure controlled without amlodipine . High metoprolol  dosage risks bradycardia. - Discontinue  amlodipine . - Reduce metoprolol  to 50 mg, monitor heart rate and blood pressure. - Send 50 mg metoprolol  prescription to pharmacy. - Instruct to half current 100 mg metoprolol  tablets until depleted.  Hyperlipidemia LDL cholesterol slightly elevated at 121 mg/dL. Weight loss and Mediterranean diet may reduce LDL further. No immediate intervention needed. - Monitor LDL cholesterol with lipid panel in January.    Dispo: She can follow-up with me in a year  Signed, Orren LOISE Fabry, PA-C

## 2024-01-27 ENCOUNTER — Ambulatory Visit: Attending: Physician Assistant | Admitting: Physician Assistant

## 2024-01-27 VITALS — BP 110/80 | HR 45 | Ht 66.0 in | Wt 162.0 lb

## 2024-01-27 DIAGNOSIS — I471 Supraventricular tachycardia, unspecified: Secondary | ICD-10-CM

## 2024-01-27 DIAGNOSIS — I10 Essential (primary) hypertension: Secondary | ICD-10-CM

## 2024-01-27 DIAGNOSIS — R002 Palpitations: Secondary | ICD-10-CM

## 2024-01-27 MED ORDER — METOPROLOL SUCCINATE ER 50 MG PO TB24: 50.0000 mg | ORAL_TABLET | Freq: Every day | ORAL | 1 refills | Status: AC

## 2024-01-27 NOTE — Patient Instructions (Signed)
 Medication Instructions:  STOP Norvasc  (Amlodipine ) DECREASE Metoprolol  to 50mg  Take 1 tablet once a day *If you need a refill on your cardiac medications before your next appointment, please call your pharmacy*  Lab Work: COMPLETE A LIPID AT YOUR PCP's OFFICE If you have labs (blood work) drawn today and your tests are completely normal, you will receive your results only by: MyChart Message (if you have MyChart) OR A paper copy in the mail If you have any lab test that is abnormal or we need to change your treatment, we will call you to review the results.  Testing/Procedures: NONE ORDERED  Follow-Up: At [facility], you and your health needs are our priority.  As part of our continuing mission to provide you with exceptional heart care, our providers are all part of one team.  This team includes your primary Cardiologist (physician) and Advanced Practice Providers or APPs (Physician Assistants and Nurse Practitioners) who all work together to provide you with the care you need, when you need it.  Your next appointment:   12 month(s)  Provider:   Orren Fabry, PA     We recommend signing up for the patient portal called MyChart.  Sign up information is provided on this After Visit Summary.  MyChart is used to connect with patients for Virtual Visits (Telemedicine).  Patients are able to view lab/test results, encounter notes, upcoming appointments, etc.  Non-urgent messages can be sent to your provider as well.   To learn more about what you can do with MyChart, go to [email or web address].   Other Instructions CONTINUE TO MONITOR YOUR BLOOD PRESSURE AND HEART RATE

## 2024-02-12 ENCOUNTER — Encounter (HOSPITAL_BASED_OUTPATIENT_CLINIC_OR_DEPARTMENT_OTHER): Payer: Self-pay | Admitting: Emergency Medicine

## 2024-02-12 ENCOUNTER — Emergency Department (HOSPITAL_BASED_OUTPATIENT_CLINIC_OR_DEPARTMENT_OTHER)
Admission: EM | Admit: 2024-02-12 | Discharge: 2024-02-12 | Disposition: A | Discharge status: Home / Self Care | Attending: Emergency Medicine | Admitting: Emergency Medicine

## 2024-02-12 ENCOUNTER — Other Ambulatory Visit: Payer: Self-pay

## 2024-02-12 ENCOUNTER — Emergency Department (HOSPITAL_BASED_OUTPATIENT_CLINIC_OR_DEPARTMENT_OTHER)

## 2024-02-12 DIAGNOSIS — K5732 Diverticulitis of large intestine without perforation or abscess without bleeding: Secondary | ICD-10-CM | POA: Diagnosis not present

## 2024-02-12 DIAGNOSIS — R112 Nausea with vomiting, unspecified: Secondary | ICD-10-CM | POA: Diagnosis present

## 2024-02-12 DIAGNOSIS — M542 Cervicalgia: Secondary | ICD-10-CM | POA: Insufficient documentation

## 2024-02-12 DIAGNOSIS — K5792 Diverticulitis of intestine, part unspecified, without perforation or abscess without bleeding: Secondary | ICD-10-CM

## 2024-02-12 LAB — COMPREHENSIVE METABOLIC PANEL WITH GFR
ALT: 25 U/L (ref 0–44)
AST: 25 U/L (ref 15–41)
Albumin: 5.1 g/dL — ABNORMAL HIGH (ref 3.5–5.0)
Alkaline Phosphatase: 71 U/L (ref 38–126)
Anion gap: 15 (ref 5–15)
BUN: 19 mg/dL (ref 6–20)
CO2: 22 mmol/L (ref 22–32)
Calcium: 10.3 mg/dL (ref 8.9–10.3)
Chloride: 101 mmol/L (ref 98–111)
Creatinine, Ser: 0.65 mg/dL (ref 0.44–1.00)
GFR, Estimated: >60 mL/min (ref >60)
Glucose, Bld: 91 mg/dL (ref 70–99)
Potassium: 3.7 mmol/L (ref 3.5–5.1)
Sodium: 138 mmol/L (ref 135–145)
Total Bilirubin: 0.9 mg/dL (ref 0.0–1.2)
Total Protein: 7.8 g/dL (ref 6.5–8.1)

## 2024-02-12 LAB — CBC WITH DIFFERENTIAL/PLATELET
Abs Immature Granulocytes: 0.03 K/uL (ref 0.00–0.07)
Basophils Absolute: 0 K/uL (ref 0.0–0.1)
Basophils Relative: 0 %
Eosinophils Absolute: 0 K/uL (ref 0.0–0.5)
Eosinophils Relative: 0 %
HCT: 39.4 % (ref 36.0–46.0)
Hemoglobin: 13.6 g/dL (ref 12.0–15.0)
Immature Granulocytes: 0 %
Lymphocytes Relative: 16 %
Lymphs Abs: 1.8 K/uL (ref 0.7–4.0)
MCH: 29.5 pg (ref 26.0–34.0)
MCHC: 34.5 g/dL (ref 30.0–36.0)
MCV: 85.5 fL (ref 80.0–100.0)
Monocytes Absolute: 0.5 K/uL (ref 0.1–1.0)
Monocytes Relative: 4 %
Neutro Abs: 8.5 K/uL — ABNORMAL HIGH (ref 1.7–7.7)
Neutrophils Relative %: 80 %
Platelets: 288 K/uL (ref 150–400)
RBC: 4.61 MIL/uL (ref 3.87–5.11)
RDW: 12.1 % (ref 11.5–15.5)
WBC: 10.8 K/uL — ABNORMAL HIGH (ref 4.0–10.5)
nRBC: 0 % (ref 0.0–0.2)

## 2024-02-12 MED ORDER — SODIUM CHLORIDE 0.9 % IV BOLUS
1000.0000 mL | Freq: Once | INTRAVENOUS | Status: AC
  Administered 2024-02-12: 1000 mL via INTRAVENOUS

## 2024-02-12 MED ORDER — ONDANSETRON HCL 4 MG/2ML IJ SOLN
4.0000 mg | Freq: Once | INTRAMUSCULAR | Status: AC
Start: 2024-02-12 — End: 2024-02-12
  Administered 2024-02-12: 4 mg via INTRAVENOUS
  Filled 2024-02-12: qty 2

## 2024-02-12 MED ORDER — ONDANSETRON HCL 4 MG PO TABS: 4.0000 mg | ORAL_TABLET | Freq: Four times a day (QID) | ORAL | 0 refills | Status: AC

## 2024-02-12 MED ORDER — MORPHINE SULFATE (PF) 4 MG/ML IV SOLN
4.0000 mg | Freq: Once | INTRAVENOUS | Status: AC
  Administered 2024-02-12: 4 mg via INTRAVENOUS
  Filled 2024-02-12: qty 1

## 2024-02-12 MED ORDER — LIDOCAINE 5 % EX PTCH
1.0000 | MEDICATED_PATCH | Freq: Once | CUTANEOUS | Status: DC
  Administered 2024-02-12: 1 via TRANSDERMAL
  Filled 2024-02-12: qty 1

## 2024-02-12 MED ORDER — IOHEXOL 300 MG/ML  SOLN
100.0000 mL | Freq: Once | INTRAMUSCULAR | Status: AC | PRN
  Administered 2024-02-12: 100 mL via INTRAVENOUS

## 2024-02-12 MED ORDER — DIPHENHYDRAMINE HCL 50 MG/ML IJ SOLN
25.0000 mg | Freq: Once | INTRAMUSCULAR | Status: AC
  Administered 2024-02-12: 25 mg via INTRAVENOUS
  Filled 2024-02-12: qty 1

## 2024-02-12 MED ORDER — AMOXICILLIN-POT CLAVULANATE 875-125 MG PO TABS
1.0000 | ORAL_TABLET | Freq: Once | ORAL | Status: AC
  Administered 2024-02-12: 1 via ORAL
  Filled 2024-02-12: qty 1

## 2024-02-12 MED ORDER — ACETAMINOPHEN 500 MG PO TABS
1000.0000 mg | ORAL_TABLET | Freq: Once | ORAL | Status: AC
  Administered 2024-02-12: 1000 mg via ORAL
  Filled 2024-02-12: qty 2

## 2024-02-12 MED ORDER — METOCLOPRAMIDE HCL 5 MG/ML IJ SOLN
10.0000 mg | Freq: Once | INTRAMUSCULAR | Status: AC
  Administered 2024-02-12: 10 mg via INTRAVENOUS
  Filled 2024-02-12: qty 2

## 2024-02-12 MED ORDER — AMOXICILLIN-POT CLAVULANATE 875-125 MG PO TABS: 1.0000 | ORAL_TABLET | Freq: Two times a day (BID) | ORAL | 0 refills | Status: AC

## 2024-02-12 NOTE — ED Triage Notes (Signed)
 Reports migraine x 2 days with uncontrollable n/v. Reports neck stiffness. Denies fever.

## 2024-02-12 NOTE — ED Notes (Signed)
 She is returned from CT per w/c at this time.

## 2024-02-12 NOTE — ED Notes (Signed)
 Discharge instructions, follow up care, and prescriptions reviewed and explained, pt verbalized understanding.

## 2024-02-12 NOTE — Discharge Instructions (Signed)
 You were seen in the emergency department for your nausea, vomiting and abdominal pain.  You have diverticulitis which is an infection of one of the outpouchings in your colon.  Have given you a prescription of antibiotics and you can complete this as prescribed.  You can take Tylenol  up to every 6 hours as needed for pain in addition to your normal home pain medications and Zofran  as needed for nausea.  You should follow-up with your primary doctor in the next few days to have your symptoms rechecked.  You should return to the emergency department if you are having fevers despite the antibiotics, significantly worsening pain, repetitive vomiting despite the nausea medicine or any other new or concerning symptoms.

## 2024-02-12 NOTE — ED Provider Notes (Signed)
 [place] EMERGENCY DEPARTMENT AT [facility] Provider Note   CSN: [id number] Arrival date & time: 02/12/24  9182     Patient presents with: Nausea   Kayla Gross is a 43 y.o. female.   Patient is a 43 year old female with a past medical history of ankylosing spondylosis and paroxysmal SVT presenting to the emergency department with nausea.  The patient states that a few days ago she started to have increasing pain in her left shoulder and neck similar to symptoms that she has with her AS with the associated headache.  She states that over the last 2 days she started develop nausea and repetitive vomiting and has been unable to keep anything down.  She states that her headache has improved and she is currently headache free.  She states that she is still having some pain in her shoulder and her neck and has been unable to take her normal pain medications including baclofen  and gabapentin  due to the nausea.  She denies any associated fever.  She states that her abdomen feels sore from vomiting but has no abdominal pain, denies any diarrhea.  She states that she has had nausea with the symptoms in the past.  The history is provided by the patient.       Prior to Admission medications   Medication Sig Start Date End Date Taking? Authorizing Provider  amoxicillin-clavulanate (AUGMENTIN) 875-125 MG tablet Take 1 tablet by mouth every 12 (twelve) hours. 02/12/24  Yes Ellouise, Shanitha Twining K, DO  ondansetron  (ZOFRAN ) 4 MG tablet Take 1 tablet (4 mg total) by mouth every 6 (six) hours. 02/12/24  Yes Kingsley, Winfred Redel K, DO  baclofen  (LIORESAL ) 10 MG tablet Take 10 mg by mouth 3 (three) times daily as needed for muscle spasms.    [provider]  gabapentin  (NEURONTIN ) 300 MG capsule TAKE 1 CAPSULE BY MOUTH AT BEDTIME AS NEEDED (PAIN). Patient taking differently: Take 300 mg by mouth 3 (three) times daily. 07/19/21   Vernetta Lonni GRADE, MD  HUMIRA, 2 PEN, 40 MG/0.4ML pen  Inject 40 mg as directed every 14 (fourteen) days.    [provider]  hydrOXYzine (ATARAX) 25 MG tablet Take 25 mg by mouth 2 (two) times daily. 05/08/22   [provider]  metoprolol  succinate (TOPROL -XL) 50 MG 24 hr tablet Take 1 tablet (50 mg total) by mouth daily. 01/27/24   Conte, Tessa N, PA-C  SUMAtriptan  (IMITREX ) 50 MG tablet Take 1 tablet (50 mg total) by mouth every 2 (two) hours as needed for migraine. May repeat in 2 hours if headache persists or recurs. 09/10/21   Joesph Shaver Scales, PA-C    Allergies: Patient has no known allergies.    Review of Systems  Updated Vital Signs BP 128/83   Pulse 64   Temp 98.2 F (36.8 C) (Oral)   Resp 16   LMP 11/02/2020 (Approximate)   SpO2 99%   Physical Exam Vitals and nursing note reviewed.  Constitutional:      General: She is not in acute distress.    Appearance: Normal appearance. She is ill-appearing.  HENT:     Head: Normocephalic and atraumatic.     Nose: Nose normal.     Mouth/Throat:     Mouth: Mucous membranes are dry.     Pharynx: Oropharynx is clear.  Eyes:     Extraocular Movements: Extraocular movements intact.     Conjunctiva/sclera: Conjunctivae normal.     Pupils: Pupils are equal, round, and reactive to light.  Cardiovascular:     Rate and Rhythm: Normal rate and regular rhythm.     Heart sounds: Normal heart sounds.  Pulmonary:     Effort: Pulmonary effort is normal.     Breath sounds: Normal breath sounds.  Abdominal:     General: Abdomen is flat.     Palpations: Abdomen is soft.     Tenderness: There is no abdominal tenderness.  Musculoskeletal:        General: Normal range of motion.     Cervical back: Normal range of motion and neck supple. Tenderness (Left trapezius muscle tenderness) present.  Skin:    General: Skin is warm and dry.  Neurological:     General: No focal deficit present.     Mental Status: She is alert and oriented to person, place, and time.  Psychiatric:         Mood and Affect: Mood normal.        Behavior: Behavior normal.     (all labs ordered are listed, but only abnormal results are displayed) Labs Reviewed  COMPREHENSIVE METABOLIC PANEL WITH GFR - Abnormal; Notable for the following components:      Result Value   Albumin 5.1 (*)    All other components within normal limits  CBC WITH DIFFERENTIAL/PLATELET - Abnormal; Notable for the following components:   WBC 10.8 (*)    Neutro Abs 8.5 (*)    All other components within normal limits    EKG: None  Radiology: CT ABDOMEN PELVIS W CONTRAST Result Date: 02/12/2024 CLINICAL DATA:  Right lower quadrant pain. EXAM: CT ABDOMEN AND PELVIS WITH CONTRAST TECHNIQUE: Multidetector CT imaging of the abdomen and pelvis was performed using the standard protocol following bolus administration of intravenous contrast. RADIATION DOSE REDUCTION: This exam was performed according to the departmental dose-optimization program which includes automated exposure control, adjustment of the mA and/or kV according to patient size and/or use of iterative reconstruction technique. CONTRAST:  OMNIPAQUE  IOHEXOL  300 MG/ML  SOLN COMPARISON:  05/22/2022 and 04/18/2020. FINDINGS: Lower chest: Lung bases are clear. Heart is enlarged. No pericardial or pleural effusion. Distal esophagus is grossly unremarkable. Hepatobiliary: Liver measures at the upper limits of normal in size, 17.9 cm. Liver and gallbladder are otherwise unremarkable. No biliary ductal dilatation. Pancreas: Negative. Spleen: Negative. Adrenals/Urinary Tract: Adrenal glands are unremarkable. Tiny low-attenuation lesion in the upper pole right kidney, too small to characterize. No specific follow-up necessary. Kidneys are otherwise unremarkable. Ureters are decompressed. Bladder is grossly unremarkable. Stomach/Bowel: Stomach, small bowel and appendix are unremarkable. Mild wall thickening and probable diverticula involving the ascending colon.  Associated pericolonic inflammatory haziness. Remainder of the colon is unremarkable. Vascular/Lymphatic: Vascular structures are unremarkable. No pathologically enlarged lymph nodes. Reproductive: Hysterectomy. Low-attenuation posterior left adnexal cyst measures 2.0 cm, unchanged and considered benign. Other: Small bilateral inguinal hernias contain fat. No free fluid. Mesenteries and peritoneum are unremarkable. Musculoskeletal: L5-S1 degenerative disc disease with minimal retrolisthesis of L5 on S1. IMPRESSION: Mild ascending colitis, possibly diverticular in origin. Electronically Signed   By: Newell Eke M.D.   On: 02/12/2024 11:31     Procedures   Medications Ordered in the ED  lidocaine  (LIDODERM ) 5 % 1-3 patch (1 patch Transdermal Patch Applied 02/12/24 0942)  amoxicillin-clavulanate (AUGMENTIN) 875-125 MG per tablet 1 tablet (has no administration in time range)  sodium chloride  0.9 % bolus 1,000 mL (0 mLs Intravenous Stopped 02/12/24 1043)  metoCLOPramide  (REGLAN ) injection 10 mg (10 mg Intravenous Given 02/12/24 0942)  acetaminophen  (TYLENOL )  tablet 1,000 mg (1,000 mg Oral Given 02/12/24 0942)  morphine  (PF) 4 MG/ML injection 4 mg (4 mg Intravenous Given 02/12/24 0943)  diphenhydrAMINE  (BENADRYL ) injection 25 mg (25 mg Intravenous Given 02/12/24 1021)  ondansetron  (ZOFRAN ) injection 4 mg (4 mg Intravenous Given 02/12/24 1023)  iohexol  (OMNIPAQUE ) 300 MG/ML solution 100 mL (100 mLs Intravenous Contrast Given 02/12/24 1050)    Clinical Course as of 02/12/24 1155  Wed Feb 12, 2024  1008 Mild leukocytosis, otherwise labs within normal range. [VK]  1025 Patient reports improvement of symptoms but still nauseous and unable to tolerate PO, also reports some akathisia. Will be given benadryl  and zofran . Will have CT to evaluate for appendicitis or other intraabdominal infection and will reassess. [VK]  1135 CTAP with probable diverticulitis of the ascending colon. Will start on antibiotics. No  signs of complications. [VK]    Clinical Course User Index [VK] Kingsley, Denina Rieger K, DO                                 Medical Decision Making This patient presents to the ED with chief complaint(s) of N/V with pertinent past medical history of AS, PSVT which further complicates the presenting complaint. The complaint involves an extensive differential diagnosis and also carries with it a high risk of complications and morbidity.    The differential diagnosis includes dehydration, electrolyte abnormality, gastroenteritis, gastritis, GERD, medication side effect, viral syndrome, no point abdominal tenderness making an intra-abdominal infection less likely, currently headache free without any focal neurologic deficits making ICH or mass effect unlikely  Additional history obtained: Additional history obtained from spouse Records reviewed Care Everywhere/External Records and outpatient cardiology  ED Course and Reassessment: On patient's arrival she is ill-appearing though hemodynamically stable in no acute distress and is dry appearing.  She will be started on fluids and will be given nausea control and pain control and will have labs to evaluate for severe dehydration and will be closely reassessed.  Independent labs interpretation:  The following labs were independently interpreted: mild leukocytosis, otherwise within normal range  Independent visualization of imaging: - I independently visualized the following imaging with scope of interpretation limited to determining acute life threatening conditions related to emergency care: CTAP, which revealed diverticulitis of ascending colon  Consultation: - Consulted or discussed management/test interpretation w/ external professional: N/A  Consideration for admission or further workup: Patient has no emergent conditions requiring admission or further work-up at this time and is stable for discharge home with primary care follow-up  Social  Determinants of health: N/A    Amount and/or Complexity of Data Reviewed Labs: ordered. Radiology: ordered.  Risk OTC drugs. Prescription drug management.       Final diagnoses:  Diverticulitis    ED Discharge Orders          Ordered    amoxicillin-clavulanate (AUGMENTIN) 875-125 MG tablet  Every 12 hours        02/12/24 1154    ondansetron  (ZOFRAN ) 4 MG tablet  Every 6 hours        02/12/24 1154               Kingsley, Hennessey Cantrell K, DO 02/12/24 1155

## 2024-03-10 ENCOUNTER — Other Ambulatory Visit: Payer: Self-pay | Admitting: Student

## 2024-03-10 DIAGNOSIS — R109 Unspecified abdominal pain: Secondary | ICD-10-CM

## 2024-03-18 ENCOUNTER — Inpatient Hospital Stay: Admission: RE | Admit: 2024-03-18 | Discharge: 2024-03-18 | Attending: Student

## 2024-03-18 DIAGNOSIS — R109 Unspecified abdominal pain: Secondary | ICD-10-CM

## 2024-04-13 ENCOUNTER — Other Ambulatory Visit: Payer: Self-pay | Admitting: Family Medicine

## 2024-04-13 DIAGNOSIS — R4789 Other speech disturbances: Secondary | ICD-10-CM

## 2024-04-15 ENCOUNTER — Encounter: Payer: Self-pay | Admitting: Neurology

## 2024-04-23 ENCOUNTER — Telehealth: Payer: Self-pay | Admitting: Neurology

## 2024-04-23 NOTE — Telephone Encounter (Signed)
 Pt called this morning and she is requesting to be placed on the waiting list, due to her symptoms  have gotten worse. Pt is having   Dizzy, Nausea, Tremors, and Fatigue at this   time. Pt stated that she has had to call out of work twice. Thanks

## 2024-04-29 ENCOUNTER — Ambulatory Visit
Admission: RE | Admit: 2024-04-29 | Discharge: 2024-04-29 | Disposition: A | Source: Ambulatory Visit | Attending: Family Medicine

## 2024-04-29 DIAGNOSIS — R4789 Other speech disturbances: Secondary | ICD-10-CM

## 2024-04-29 MED ORDER — GADOPICLENOL 0.5 MMOL/ML IV SOLN
7.0000 mL | Freq: Once | INTRAVENOUS | Status: AC | PRN
  Administered 2024-04-29: 7 mL via INTRAVENOUS

## 2024-06-03 ENCOUNTER — Ambulatory Visit: Payer: Self-pay | Admitting: Neurology
